# Patient Record
Sex: Female | Born: 1980 | Race: White | Hispanic: No | Marital: Single | Smoking: Former smoker
Health system: Southern US, Community
[De-identification: ages and names within clinical notes are randomized; demographics above are authoritative.]

## PROBLEM LIST (undated history)

## (undated) DIAGNOSIS — Z8619 Personal history of other infectious and parasitic diseases: Secondary | ICD-10-CM

## (undated) HISTORY — DX: Personal history of other infectious and parasitic diseases: Z86.19

## (undated) HISTORY — PX: WISDOM TOOTH EXTRACTION: SHX21

## (undated) HISTORY — PX: CHOLECYSTECTOMY: SHX55

## (undated) HISTORY — PX: BREAST SURGERY: SHX581

---
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
## 2001-01-09 ENCOUNTER — Inpatient Hospital Stay (HOSPITAL_COMMUNITY): Admission: AD | Admit: 2001-01-09 | Discharge: 2001-01-09 | Payer: Self-pay | Admitting: *Deleted

## 2001-02-05 ENCOUNTER — Other Ambulatory Visit: Admission: RE | Admit: 2001-02-05 | Discharge: 2001-02-05 | Payer: Self-pay | Admitting: Obstetrics and Gynecology

## 2001-02-14 ENCOUNTER — Inpatient Hospital Stay (HOSPITAL_COMMUNITY): Admission: AD | Admit: 2001-02-14 | Discharge: 2001-02-14 | Payer: Self-pay | Admitting: Obstetrics and Gynecology

## 2001-07-01 ENCOUNTER — Encounter: Payer: Self-pay | Admitting: Obstetrics and Gynecology

## 2001-07-01 ENCOUNTER — Observation Stay (HOSPITAL_COMMUNITY): Admission: AD | Admit: 2001-07-01 | Discharge: 2001-07-02 | Payer: Self-pay | Admitting: Obstetrics and Gynecology

## 2001-09-02 ENCOUNTER — Inpatient Hospital Stay (HOSPITAL_COMMUNITY): Admission: AD | Admit: 2001-09-02 | Discharge: 2001-09-05 | Payer: Self-pay | Admitting: Obstetrics and Gynecology

## 2001-09-19 ENCOUNTER — Emergency Department (HOSPITAL_COMMUNITY): Admission: EM | Admit: 2001-09-19 | Discharge: 2001-09-19 | Payer: Self-pay | Admitting: Emergency Medicine

## 2002-01-16 ENCOUNTER — Other Ambulatory Visit: Admission: RE | Admit: 2002-01-16 | Discharge: 2002-01-16 | Payer: Self-pay | Admitting: Obstetrics and Gynecology

## 2002-05-10 ENCOUNTER — Other Ambulatory Visit: Admission: RE | Admit: 2002-05-10 | Discharge: 2002-05-10 | Payer: Self-pay | Admitting: Obstetrics and Gynecology

## 2003-12-10 ENCOUNTER — Emergency Department (HOSPITAL_COMMUNITY): Admission: EM | Admit: 2003-12-10 | Discharge: 2003-12-10 | Payer: Self-pay | Admitting: Emergency Medicine

## 2003-12-29 ENCOUNTER — Other Ambulatory Visit: Admission: RE | Admit: 2003-12-29 | Discharge: 2003-12-29 | Payer: Self-pay | Admitting: Obstetrics and Gynecology

## 2004-04-04 ENCOUNTER — Inpatient Hospital Stay (HOSPITAL_COMMUNITY): Admission: AD | Admit: 2004-04-04 | Discharge: 2004-04-06 | Payer: Self-pay | Admitting: Obstetrics and Gynecology

## 2004-12-03 ENCOUNTER — Ambulatory Visit (HOSPITAL_COMMUNITY): Admission: RE | Admit: 2004-12-03 | Discharge: 2004-12-03 | Payer: Self-pay | Admitting: Obstetrics and Gynecology

## 2004-12-29 ENCOUNTER — Other Ambulatory Visit: Admission: RE | Admit: 2004-12-29 | Discharge: 2004-12-29 | Payer: Self-pay | Admitting: Obstetrics and Gynecology

## 2005-05-29 ENCOUNTER — Inpatient Hospital Stay (HOSPITAL_COMMUNITY): Admission: AD | Admit: 2005-05-29 | Discharge: 2005-05-29 | Payer: Self-pay | Admitting: Obstetrics and Gynecology

## 2005-06-02 ENCOUNTER — Inpatient Hospital Stay (HOSPITAL_COMMUNITY): Admission: AD | Admit: 2005-06-02 | Discharge: 2005-06-04 | Payer: Self-pay | Admitting: Obstetrics and Gynecology

## 2007-12-26 ENCOUNTER — Emergency Department (HOSPITAL_COMMUNITY): Admission: EM | Admit: 2007-12-26 | Discharge: 2007-12-26 | Payer: Self-pay | Admitting: Emergency Medicine

## 2009-10-25 ENCOUNTER — Emergency Department (HOSPITAL_COMMUNITY): Admission: EM | Admit: 2009-10-25 | Discharge: 2009-10-25 | Payer: Self-pay | Admitting: Emergency Medicine

## 2010-11-07 LAB — URINALYSIS, ROUTINE W REFLEX MICROSCOPIC
Bilirubin Urine: NEGATIVE
Glucose, UA: NEGATIVE mg/dL
Ketones, ur: NEGATIVE mg/dL
Nitrite: NEGATIVE
Protein, ur: NEGATIVE mg/dL
Specific Gravity, Urine: 1.005 (ref 1.005–1.030)
Urobilinogen, UA: 0.2 mg/dL (ref 0.0–1.0)
pH: 6.5 (ref 5.0–8.0)

## 2010-11-07 LAB — POCT I-STAT, CHEM 8
Calcium, Ion: 1.14 mmol/L (ref 1.12–1.32)
Chloride: 110 mEq/L (ref 96–112)
Glucose, Bld: 95 mg/dL (ref 70–99)
HCT: 41 % (ref 36.0–46.0)
Hemoglobin: 13.9 g/dL (ref 12.0–15.0)
TCO2: 22 mmol/L (ref 0–100)

## 2010-11-07 LAB — URINE MICROSCOPIC-ADD ON

## 2010-11-07 LAB — POCT PREGNANCY, URINE: Preg Test, Ur: NEGATIVE

## 2010-11-07 LAB — URINE CULTURE: Culture: NO GROWTH

## 2010-12-31 NOTE — H&P (Signed)
[facility]  Patient:    [name], [name] Visit Number: [id number] MRN: [id number]          Service Type: OBS Location: 910B 9158 01 Attending Physician:  Jaymes Graff A Dictated by:   Mack Guise, C.N.M. Admit Date:  09/02/2001                           History and Physical  CHIEF COMPLAINT:              [name] is a 30 year old gravida 2 para 0, 0-1-0, at 40-2/7th weeks, estimated date of delivery August 31, 2001, who presents with spontaneous rupture of membranes at 4 a.m. this morning with clear fluid.  HISTORY OF PRESENT ILLNESS:   She is contracting mildly and irregularity. Positive fetal movement.  No bleeding.  Denies any headache, visual changes, or epigastric pain.  [name] did not phone until approximately 8 p.m. this evening despite her water being broken through the day.  She does report any signs or symptoms of infection.  She is group B strep negative.  Her pregnancy has been followed by the CNM service at [facility], and is remarkable for:                               1. PENICILLIN allergy.                               2. History of abnormal Pap smear.                               3. First trimester spotting.                               4. Group B strep negative.  PAST OBSTETRICAL HISTORY:     1. In January 2001, elective AB with no                                  problems.                               2. Present pregnancy.  PRENATAL LABORATORY DATA:     On February 05, 2001 hemoglobin and hematocrit 12.6 and 37.5, platelets 227,000.  Blood type and Rh B-positive.  Antibody screen negative.  VDRL nonreactive.  Rubella immune.  Hepatitis B surface antigen negative.  HIV nonreactive.  Pap smear within normal limits.  AFB/free beta hCG within normal range.  At 28 weeks one hour glucose challenge 113.  At 36 weeks culture of the vaginal tract negative for group B strep.  HISTORY PRESENT PREGNANCY:    The patient was  initially evaluated at the office of [facility] on February 07, 2001 at approximately [redacted] weeks gestation.  EDD confirmed by LMP dates and early pregnancy ultrasonography.  This patients pregnancy has been essentially unremarkable.  She has been size equal to dates throughout, normotensive with no proteinuria.  PAST MEDICAL HISTORY:         History of abnormal Pap and colpo.  PAST SURGICAL HISTORY:  Wisdom teeth.  FAMILY HISTORY:               Patients maternal aunt with history of MI. Paternal grandmother, hypertension.  Patients mother, varicose veins. Maternal cousin with diabetes.  Mother grandfather, prostate CA.  Maternal aunt, stroke.  Patients Dad with history of migraine headaches and patients mother with anxiety attacks.  GENETIC HISTORY:              Father of the baby with a history of heart murmur at birth.  No functional difficulties with his heart.  Maternal cousin mentally retarded.  ALLERGIES:                    PENICILLIN.  SOCIAL HISTORY:               Denies the use of tobacco, alcohol, or illicit drugs.  [name] is a single 30 year old Caucasian female.  The father of the baby, [name], is involved and supportive.  They are [place] in their faith.  REVIEW OF SYSTEMS:            There are no signs of symptoms suggestive of focal or systemic disease, and the patient is typical of one with intrauterine pregnancy at term.  PHYSICAL EXAMINATION:  VITAL SIGNS:                  Stable.  Afebrile.  HEENT:                        Unremarkable.  HEART:                        Regular rate and rhythm.  LUNGS:                        Clear.  ABDOMEN:                      Gravid contour.  Uterine fundus is noted to extend 38 cm above the level of the pubic symphysis.  Leopolds maneuver finds the infant to be in longitudinal lie, cephalic presentation.  Estimated fetal weight is 7-1/2 pounds.  Fetal heart rate is reactive and reassuring.  The patient is contracting  mildly and irregularly.  PELVIC:                       Sterile speculum examination finds positive pooling, positive nitrazine, positive fern.  Cervical examination finds the cervix to b 1 cm dilated, 60% effaced.  NEUROLOGIC:                   DTRs 1+ with no clonus.  ASSESSMENT:                   Intrauterine pregnancy at term with premature rupture of membranes x18 hours.  PLAN:                         Admit per Dr. Jaymes Graff.  Cervical ripening with the use of Cytotec.  Pitocin augmentation of labor.  The risks and benefits of cervical ripening and induction of labor were discussed in detail with the patient in language she could understand and she has indicated her agreement. Dictated by:   Mack Guise, C.N.M. Attending Physician:  Michael Litter DD:  09/02/01 TD:  09/03/01 Job: [id number]

## 2010-12-31 NOTE — H&P (Signed)
NAME:  [name], [name]               ACCOUNT NO.:  [id number]   MEDICAL RECORD NO.:  [id number]          PATIENT TYPE:  INP   LOCATION:  9164                          FACILITY:  [facility]   PHYSICIAN:  Naima A. Dillard, M.D. DATE OF BIRTH:  [birth year]   DATE OF ADMISSION:  06/02/2005  DATE OF DISCHARGE:                                HISTORY & PHYSICAL   HISTORY:  This is a 30 year old gravida 4, para 2, 0, 1, 2, at 39-3/7 weeks  who presents with leaking of fluid x2-3 hours which is clear. She does  report mild contractions. Pregnancy has been followed by the nurse midwife  service and remarkable for:  1.  Closely spaced pregnancy.  2.  Unsure last menstrual period.  3.  Conceived on OCPs.  4.  Obesity.  5.  Group B strep negative.   OB HISTORY:  Remarkable for an elective abortion in 2001. Vaginal delivery  of a female in 2003 at [redacted] weeks gestation, weighing 7 pounds, 3 ounces with  no complications. She had a vaginal delivery in 2005 of a female at [redacted] weeks  gestation, weighing 7 pounds, 3 ounces with no complications.   PAST MEDICAL HISTORY:  1.  Remarkable for history of abnormal Pap.  2.  Childhood Varicella.   SURGICAL HISTORY:  Remarkable for wisdom teeth in 2002.   FAMILY HISTORY:  Remarkable for an aunt with MI. Grandmother with  hypertension. Mother with varicosities. A cousin with diabetes. Father with  migraines. An aunt with stroke. A cousin with seizures. Grandfather with  prostate cancer. Mother with anxiety disorder.   GENETIC HISTORY:  Remarkable for father of the baby with a heart murmur, and  a cousin who is retarded.   SOCIAL HISTORY:  The patient is single. Father of the baby, [name], is  involved and supportive. She is of the [place] faith. She denies any  alcohol, tobacco or drug use.   PRENATAL LABS:  Hemoglobin 11.7, platelets 174,000. Blood type B positive,  antibody screen negative. RPR nonreactive. Rubella immune. Hepatitis  negative. HIV  negative. Pap test normal. Cystic fibrosis negative.   HISTORY OF CURRENT PREGNANCY:  The patient entered care at [redacted] weeks  gestation. She had an ultrasound for dates. At that time a quadruple screen  was declined. Ultrasound at 18 weeks was normal. She had a Glucola at 27  weeks which was normal. She had an ultrasound at 28 weeks with normal  findings. She decided at 30 weeks to have a tubal ligation secondary to not  having a boy baby this time although she does seem somewhat ambivalent about  that. She had a negative group B strep at term.   PHYSICAL EXAMINATION:  VITAL SIGNS:  Stable. Afebrile.  HEENT:  Within normal limits.  NECK:  Thyroid normal, not enlarged.  CHEST:  Clear to auscultation.  HEART:  Regular rate and rhythm.  ABDOMEN:  Gravid, vertex, Leopold's. External fetal monitor shows a reactive  fetal heart rate with mild contractions every 2 minutes.  PELVIC:  Vaginal exam shows clear fluid leaking which is positive for  Nitrazine and grossly pooling. Cervix is 3-4, 75%, -2, vertex.  EXTREMITIES:  Within normal limits.   ASSESSMENT:  1.  Intrauterine pregnancy at term.  2.  Premature rupture of membranes at term.  3.  Prodromal labor.  4.  Group B strep negative.   PLAN:  1.  Options of expectant management versus Pitocin augmentation were      discussed with the patient. She elects to proceed with Pitocin      augmentation which will be done per low dose protocol.  2.  Epidural p.r.n.  3.  Routine CNM orders.  4.  Anticipate spontaneous vaginal delivery.      Marie L. Williams, C.N.M.      Naima A. Normand Sloop, M.D.  Electronically Signed    MLW/MEDQ  D:  06/02/2005  T:  06/02/2005  Job:  [id number]

## 2010-12-31 NOTE — H&P (Signed)
NAME:  [name], [name]                         ACCOUNT NO.:  [id number]   MEDICAL RECORD NO.:  [id number]                   PATIENT TYPE:  INP   LOCATION:  9173                                 FACILITY:  [facility]   PHYSICIAN:  Naima A. Dillard, M.D.              DATE OF BIRTH:  [birth year]   DATE OF ADMISSION:  04/04/2004  DATE OF DISCHARGE:                                HISTORY & PHYSICAL   [name] is a 30 year old gravida 3, para 1-0-1-1.  She is at [redacted] weeks  gestation, EDD April 23, 2004 by pregnancy ultrasound.  She presents with  spontaneous rupture of membranes at home 0945 this a.m., clear fluid,  positive fetal movement.  She reports mild contractions.  No vaginal  bleeding.  Denies any history, visual changes, or epigastric pain.  Her  pregnancy has been followed by the C.N.M. service at [facility] and is remarkable  for questionable LMP, late prenatal care, obesity.  This patient entered  prenatal care on November 27, 2003 at approximately [redacted] weeks gestation.  EDC  determined by pregnancy ultrasound at 19 weeks 3 days.  Her pregnancy is  significant for weight loss of 16 pounds throughout her pregnancy.  Patient  did report that her appetite was decreased, but states she was eating with  no nausea or vomiting.  She began her pregnancy with a weight of 249.  She  has been followed with serial ultrasounds approximately one per month which  has shown adequate and appropriate growth.  At 34 weeks ultrasound was at  the 75th-90th percentile.  Otherwise, her pregnancy has been essentially  unremarkable.  She has been normotensive with no proteinuria.   PRENATAL LABORATORIES:  On November 27, 2003 hemoglobin and hematocrit 11.5 and  35.6, platelets 184,000.  Blood type and Rh B+.  Antibody screen negative.  VDRL nonreactive.  Rubella immune.  Hepatitis B surface antigen negative.  HIV nonreactive.  GC and Chlamydia negative and negative.  __________  testing negative.  On December 01, 2003  one-hour glucose challenge shows within  normal limits.   SOCIAL HISTORY:  [name] is a 30 year old Caucasian female.  She is  single.  Christian in her faith.  [name] is the father of the baby and  he is supportive and here at the hospital.   PAST MEDICAL HISTORY:  1. Significant for an abnormal Pap smear in 2001 with colposcopy.  All     further Pap smears have been within normal limits.  2. Patient had her wisdom teeth removed.   FAMILY HISTORY:  Maternal aunt with a history of MI.  Paternal grandmother  with a history of chronic hypertension.  Maternal grandfather prostate CA.  Patient's mother suffers with anxiety and the father of the baby has a heart  murmur which has never required surgery or medication.   ALLERGIES:  PENICILLIN.   HABITS:  She denies the use of tobacco,  alcohol, or illicit drugs.   PAST OBSTETRICAL HISTORY:  In 2003 patient had a normal spontaneous vaginal  delivery with the birth of a 7 pound 3 ounce female infant at term weighing  7 pounds 3 ounces with no complications.  In 2001 patient had a first  trimester elective AB with no complications and the present pregnancy.   REVIEW OF SYSTEMS:  There are no signs or symptoms of focal or systemic  disease and the patient is typical of one with a uterine pregnancy at term  in labor.   PHYSICAL EXAMINATION:  VITAL SIGNS:  Stable, afebrile.  HEENT:  Unremarkable.  HEART:  Regular rate and rhythm.  LUNGS:  Clear.  ABDOMEN:  Gravid in its contour.  Uterine fundus is noted to extend 40 cm  above the level of the pubic symphysis.  Leopold's maneuvers finds the  infant to be in a longitudinal lie, cephalic presentation and the estimated  fetal weight is 8-8.5 pounds.  The baseline of the fetal heart rate monitor  is 140s with average long-term variability.  Reactivity is present with no  periodic changes.  PELVIC:  Digital examination of the cervix finds it to be 3 cm dilated, 70%  effaced with the  cephalic presenting part at a -2 station.  EXTREMITIES:  No pathologic edema.  DTRs are 1+ with no clonus.   ASSESSMENT:  1. Intrauterine pregnancy at term.  2. Premature rupture of membranes in early labor.   PLAN:  1. Admit per Dr. Jaymes Graff.  2. Routine C.N.M. care.     Rica Koyanagi, C.N.M.               Naima A. Normand Sloop, M.D.    SDM/MEDQ  D:  04/04/2004  T:  04/04/2004  Job:  [id number]
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
## 2012-11-14 LAB — OB RESULTS CONSOLE RPR: RPR: NONREACTIVE

## 2012-11-14 LAB — OB RESULTS CONSOLE ABO/RH: RH Type: POSITIVE

## 2012-11-14 LAB — OB RESULTS CONSOLE GC/CHLAMYDIA: Chlamydia: NEGATIVE

## 2012-11-14 LAB — OB RESULTS CONSOLE HEPATITIS B SURFACE ANTIGEN: Hepatitis B Surface Ag: NEGATIVE

## 2012-11-14 LAB — OB RESULTS CONSOLE HIV ANTIBODY (ROUTINE TESTING): HIV: NONREACTIVE

## 2012-11-30 ENCOUNTER — Emergency Department (HOSPITAL_BASED_OUTPATIENT_CLINIC_OR_DEPARTMENT_OTHER)
Admission: EM | Admit: 2012-11-30 | Discharge: 2012-11-30 | Disposition: A | Discharge status: Home / Self Care | Payer: BC Managed Care – PPO | Attending: Emergency Medicine | Admitting: Emergency Medicine

## 2012-11-30 ENCOUNTER — Encounter (HOSPITAL_BASED_OUTPATIENT_CLINIC_OR_DEPARTMENT_OTHER): Payer: Self-pay

## 2012-11-30 DIAGNOSIS — L509 Urticaria, unspecified: Secondary | ICD-10-CM | POA: Insufficient documentation

## 2012-11-30 DIAGNOSIS — L259 Unspecified contact dermatitis, unspecified cause: Secondary | ICD-10-CM

## 2012-11-30 DIAGNOSIS — R21 Rash and other nonspecific skin eruption: Secondary | ICD-10-CM | POA: Insufficient documentation

## 2012-11-30 DIAGNOSIS — O218 Other vomiting complicating pregnancy: Secondary | ICD-10-CM | POA: Insufficient documentation

## 2012-11-30 MED ORDER — LORATADINE 10 MG PO TABS: 10.0000 mg | ORAL_TABLET | Freq: Every day | ORAL | Status: DC

## 2012-11-30 MED ORDER — FAMOTIDINE 20 MG PO TABS: 20.0000 mg | ORAL_TABLET | Freq: Two times a day (BID) | ORAL | Status: DC

## 2012-11-30 MED ORDER — PREDNISONE 20 MG PO TABS: ORAL_TABLET | ORAL | Status: DC

## 2012-11-30 NOTE — ED Notes (Signed)
Pt reports a 3 week history of a rash on bilateral legs. Pt is 3 months pregnant.

## 2012-11-30 NOTE — ED Provider Notes (Signed)
History     CSN: [id number]  Arrival date & time 11/30/12  1331   First MD Initiated Contact with Patient 11/30/12 1507      Chief Complaint  Patient presents with  . Rash    (Consider location/radiation/quality/duration/timing/severity/associated sxs/prior treatment) HPI This 32 year old pregnant female has 3 weeks of an itchy non-painful rash on both lower legs with scattered hives with slight clear blisters after working outside in the field, the rash is not responsive to over-the-counter calamine lotion, she has no systemic illness symptoms, she is no fever tongue swelling lip swelling stridor or drooling shortness of breath chest pain cough abdominal pain vaginal bleeding bruising rash painful rash or other concerns. History reviewed. No pertinent past medical history.  Past Surgical History  Procedure Laterality Date  . Cholecystectomy      No family history on file.  History  Substance Use Topics  . Smoking status: Not on file  . Smokeless tobacco: Never Used  . Alcohol Use: No    OB History   Grav Para Term Preterm Abortions TAB SAB Ect Mult Living   1               Review of Systems 10 Systems reviewed and are negative for acute change except as noted in the HPI. Allergies  Penicillins  Home Medications   Current Outpatient Rx  Name  Route  Sig  Dispense  Refill  . famotidine (PEPCID) 20 MG tablet   Oral   Take 1 tablet (20 mg total) by mouth 2 (two) times daily.   10 tablet   0   . loratadine (CLARITIN) 10 MG tablet   Oral   Take 1 tablet (10 mg total) by mouth daily. One po daily x 5 days   5 tablet   0   . predniSONE (DELTASONE) 20 MG tablet      3 tabs po daily x 3 days, then 2 tabs x 3 days, then 1.5 tabs x 3 days, then 1 tab x 3 days, then 0.5 tabs x 3 days   27 tablet   0     BP 134/76  Pulse 84  Temp(Src) 97.9 F (36.6 C) (Oral)  Resp 18  Ht 5\' 8"  (1.727 m)  Wt 265 lb (120.203 kg)  BMI 40.3 kg/m2  SpO2 99%  LMP  08/29/2012  Physical Exam  Nursing note and vitals reviewed. Constitutional:  Awake, alert, nontoxic appearance.  HENT:  Head: Atraumatic.  Eyes: Right eye exhibits no discharge. Left eye exhibits no discharge.  Neck: Neck supple.  Cardiovascular: Normal rate and regular rhythm.   No murmur heard. Pulmonary/Chest: Effort normal and breath sounds normal. No respiratory distress. She has no wheezes. She has no rales. She exhibits no tenderness.  Abdominal: Soft. There is no tenderness. There is no rebound.  Musculoskeletal: She exhibits no edema and no tenderness.  Baseline ROM, no obvious new focal weakness.  Neurological: She is alert.  Mental status and motor strength appears baseline for patient and situation.  Skin: Rash noted.  Bilateral lower legs have scattered urticarial plaques easily blanched nontender no petechiae no purpura if you do have some very superficial clear vesicles and some of the hives occur in linear streaks  Psychiatric: She has a normal mood and affect.    ED Course  Procedures (including critical care time)  I doubt cellulitis, necrotizing fasciitis, abscess, sepsis, anaphylaxis, or other condition requiring further evaluation today in the emergency room. Patient informed of clinical course,  understand medical decision-making process, and agree with plan. Labs Reviewed - No data to display No results found.   1. Contact dermatitis       MDM          Hurman Horn, MD 11/30/12 2035

## 2013-06-11 ENCOUNTER — Telehealth (HOSPITAL_COMMUNITY): Payer: Self-pay | Admitting: *Deleted

## 2013-06-11 ENCOUNTER — Encounter (HOSPITAL_COMMUNITY): Payer: Self-pay | Admitting: *Deleted

## 2013-06-11 LAB — OB RESULTS CONSOLE GBS: GBS: NEGATIVE

## 2013-06-11 NOTE — Telephone Encounter (Signed)
Preadmission screen  

## 2013-06-12 ENCOUNTER — Encounter (HOSPITAL_COMMUNITY): Payer: Self-pay

## 2013-06-12 ENCOUNTER — Encounter (HOSPITAL_COMMUNITY): Payer: BC Managed Care – PPO | Admitting: Anesthesiology

## 2013-06-12 ENCOUNTER — Inpatient Hospital Stay (HOSPITAL_COMMUNITY)
Admission: RE | Admit: 2013-06-12 | Discharge: 2013-06-14 | DRG: 767 | Disposition: A | Discharge status: Home / Self Care | Payer: BC Managed Care – PPO | Source: Ambulatory Visit | Attending: Obstetrics and Gynecology | Admitting: Obstetrics and Gynecology

## 2013-06-12 ENCOUNTER — Inpatient Hospital Stay (HOSPITAL_COMMUNITY): Payer: BC Managed Care – PPO | Admitting: Anesthesiology

## 2013-06-12 ENCOUNTER — Inpatient Hospital Stay (HOSPITAL_COMMUNITY)
Admission: EM | Admit: 2013-06-12 | Discharge: 2013-06-12 | Disposition: A | Discharge status: Home / Self Care | Payer: BC Managed Care – PPO | Attending: Obstetrics and Gynecology | Admitting: Obstetrics and Gynecology

## 2013-06-12 VITALS — BP 94/57 | HR 77 | Temp 97.6°F | Resp 18 | Ht 68.0 in | Wt 255.0 lb

## 2013-06-12 DIAGNOSIS — Z87891 Personal history of nicotine dependence: Secondary | ICD-10-CM

## 2013-06-12 DIAGNOSIS — Z302 Encounter for sterilization: Secondary | ICD-10-CM

## 2013-06-12 DIAGNOSIS — Z880 Allergy status to penicillin: Secondary | ICD-10-CM

## 2013-06-12 LAB — ABO/RH: ABO/RH(D): B POS

## 2013-06-12 LAB — CBC
HCT: 35.2 % — ABNORMAL LOW (ref 36.0–46.0)
Hemoglobin: 12.2 g/dL (ref 12.0–15.0)
MCH: 29 pg (ref 26.0–34.0)
MCHC: 34.7 g/dL (ref 30.0–36.0)
Platelets: 153 10*3/uL (ref 150–400)
RDW: 13 % (ref 11.5–15.5)

## 2013-06-12 MED ORDER — LACTATED RINGERS IV SOLN
INTRAVENOUS | Status: AC
  Administered 2013-06-13 (×2): via INTRAVENOUS

## 2013-06-12 MED ORDER — CITRIC ACID-SODIUM CITRATE 334-500 MG/5ML PO SOLN: 30.0000 mL | ORAL | Status: DC | PRN

## 2013-06-12 MED ORDER — MEASLES, MUMPS & RUBELLA VAC ~~LOC~~ INJ
0.5000 mL | INJECTION | Freq: Once | SUBCUTANEOUS | Status: DC
  Filled 2013-06-12: qty 0.5

## 2013-06-12 MED ORDER — SIMETHICONE 80 MG PO CHEW: 80.0000 mg | CHEWABLE_TABLET | ORAL | Status: DC | PRN

## 2013-06-12 MED ORDER — DIBUCAINE 1 % RE OINT: 1.0000 "application " | TOPICAL_OINTMENT | RECTAL | Status: DC | PRN

## 2013-06-12 MED ORDER — DIPHENHYDRAMINE HCL 50 MG/ML IJ SOLN: 12.5000 mg | INTRAMUSCULAR | Status: DC | PRN

## 2013-06-12 MED ORDER — OXYCODONE-ACETAMINOPHEN 5-325 MG PO TABS: 1.0000 | ORAL_TABLET | ORAL | Status: DC | PRN

## 2013-06-12 MED ORDER — ZOLPIDEM TARTRATE 5 MG PO TABS: 5.0000 mg | ORAL_TABLET | Freq: Every evening | ORAL | Status: DC | PRN

## 2013-06-12 MED ORDER — EPHEDRINE 5 MG/ML INJ
10.0000 mg | INTRAVENOUS | Status: DC | PRN
  Filled 2013-06-12: qty 4
  Filled 2013-06-12: qty 2

## 2013-06-12 MED ORDER — FAMOTIDINE 20 MG PO TABS
20.0000 mg | ORAL_TABLET | Freq: Two times a day (BID) | ORAL | Status: DC
  Administered 2013-06-13: 20 mg via ORAL
  Filled 2013-06-12: qty 1

## 2013-06-12 MED ORDER — EPHEDRINE 5 MG/ML INJ
10.0000 mg | INTRAVENOUS | Status: DC | PRN
  Filled 2013-06-12: qty 2

## 2013-06-12 MED ORDER — OXYTOCIN 40 UNITS IN LACTATED RINGERS INFUSION - SIMPLE MED
1.0000 m[IU]/min | INTRAVENOUS | Status: DC
  Administered 2013-06-12: 1 m[IU]/min via INTRAVENOUS
  Filled 2013-06-12: qty 1000

## 2013-06-12 MED ORDER — WITCH HAZEL-GLYCERIN EX PADS: 1.0000 "application " | MEDICATED_PAD | CUTANEOUS | Status: DC | PRN

## 2013-06-12 MED ORDER — PRENATAL MULTIVITAMIN CH: 1.0000 | ORAL_TABLET | Freq: Every day | ORAL | Status: DC

## 2013-06-12 MED ORDER — PHENYLEPHRINE 40 MCG/ML (10ML) SYRINGE FOR IV PUSH (FOR BLOOD PRESSURE SUPPORT)
80.0000 ug | PREFILLED_SYRINGE | INTRAVENOUS | Status: DC | PRN
  Filled 2013-06-12: qty 10
  Filled 2013-06-12: qty 2

## 2013-06-12 MED ORDER — OXYTOCIN 40 UNITS IN LACTATED RINGERS INFUSION - SIMPLE MED: 62.5000 mL/h | INTRAVENOUS | Status: DC

## 2013-06-12 MED ORDER — IBUPROFEN 600 MG PO TABS: 600.0000 mg | ORAL_TABLET | Freq: Four times a day (QID) | ORAL | Status: DC | PRN

## 2013-06-12 MED ORDER — ONDANSETRON HCL 4 MG/2ML IJ SOLN: 4.0000 mg | INTRAMUSCULAR | Status: DC | PRN

## 2013-06-12 MED ORDER — TERBUTALINE SULFATE 1 MG/ML IJ SOLN: 0.2500 mg | Freq: Once | INTRAMUSCULAR | Status: DC | PRN

## 2013-06-12 MED ORDER — IBUPROFEN 600 MG PO TABS
600.0000 mg | ORAL_TABLET | Freq: Four times a day (QID) | ORAL | Status: DC
  Administered 2013-06-12 – 2013-06-14 (×6): 600 mg via ORAL
  Filled 2013-06-12 (×6): qty 1

## 2013-06-12 MED ORDER — DIPHENHYDRAMINE HCL 25 MG PO CAPS: 25.0000 mg | ORAL_CAPSULE | Freq: Four times a day (QID) | ORAL | Status: DC | PRN

## 2013-06-12 MED ORDER — ONDANSETRON HCL 4 MG PO TABS: 4.0000 mg | ORAL_TABLET | ORAL | Status: DC | PRN

## 2013-06-12 MED ORDER — LIDOCAINE HCL (PF) 1 % IJ SOLN
INTRAMUSCULAR | Status: DC | PRN
  Administered 2013-06-12 (×2): 5 mL

## 2013-06-12 MED ORDER — OXYTOCIN 40 UNITS IN LACTATED RINGERS INFUSION - SIMPLE MED: 62.5000 mL/h | INTRAVENOUS | Status: DC | PRN

## 2013-06-12 MED ORDER — LANOLIN HYDROUS EX OINT: TOPICAL_OINTMENT | CUTANEOUS | Status: DC | PRN

## 2013-06-12 MED ORDER — PHENYLEPHRINE 40 MCG/ML (10ML) SYRINGE FOR IV PUSH (FOR BLOOD PRESSURE SUPPORT)
80.0000 ug | PREFILLED_SYRINGE | INTRAVENOUS | Status: DC | PRN
  Filled 2013-06-12: qty 2

## 2013-06-12 MED ORDER — LACTATED RINGERS IV SOLN: 500.0000 mL | INTRAVENOUS | Status: DC | PRN

## 2013-06-12 MED ORDER — SENNOSIDES-DOCUSATE SODIUM 8.6-50 MG PO TABS
2.0000 | ORAL_TABLET | ORAL | Status: DC
  Administered 2013-06-12 – 2013-06-14 (×2): 2 via ORAL
  Filled 2013-06-12 (×2): qty 2

## 2013-06-12 MED ORDER — FENTANYL 2.5 MCG/ML BUPIVACAINE 1/10 % EPIDURAL INFUSION (WH - ANES)
14.0000 mL/h | INTRAMUSCULAR | Status: DC | PRN
  Administered 2013-06-12 (×2): 14 mL/h via EPIDURAL
  Filled 2013-06-12 (×2): qty 125

## 2013-06-12 MED ORDER — TETANUS-DIPHTH-ACELL PERTUSSIS 5-2.5-18.5 LF-MCG/0.5 IM SUSP: 0.5000 mL | Freq: Once | INTRAMUSCULAR | Status: DC

## 2013-06-12 MED ORDER — LACTATED RINGERS IV SOLN
INTRAVENOUS | Status: DC
  Administered 2013-06-12 (×3): via INTRAVENOUS

## 2013-06-12 MED ORDER — LIDOCAINE HCL (PF) 1 % IJ SOLN
30.0000 mL | INTRAMUSCULAR | Status: DC | PRN
  Filled 2013-06-12 (×2): qty 30

## 2013-06-12 MED ORDER — ONDANSETRON HCL 4 MG/2ML IJ SOLN: 4.0000 mg | Freq: Four times a day (QID) | INTRAMUSCULAR | Status: DC | PRN

## 2013-06-12 MED ORDER — OXYTOCIN BOLUS FROM INFUSION
500.0000 mL | INTRAVENOUS | Status: DC
  Administered 2013-06-12: 500 mL via INTRAVENOUS

## 2013-06-12 MED ORDER — LACTATED RINGERS IV SOLN
500.0000 mL | Freq: Once | INTRAVENOUS | Status: AC
  Administered 2013-06-12: 500 mL via INTRAVENOUS

## 2013-06-12 MED ORDER — BENZOCAINE-MENTHOL 20-0.5 % EX AERO: 1.0000 "application " | INHALATION_SPRAY | CUTANEOUS | Status: DC | PRN

## 2013-06-12 MED ORDER — ACETAMINOPHEN 325 MG PO TABS: 650.0000 mg | ORAL_TABLET | ORAL | Status: DC | PRN

## 2013-06-12 NOTE — Anesthesia Procedure Notes (Signed)
Epidural Patient location during procedure: OB Start time: 06/12/2013 12:17 PM  Staffing Anesthesiologist: Brayton Caves Performed by: anesthesiologist   Preanesthetic Checklist Completed: patient identified, site marked, surgical consent, pre-op evaluation, timeout performed, IV checked, risks and benefits discussed and monitors and equipment checked  Epidural Patient position: sitting Prep: site prepped and draped and DuraPrep Patient monitoring: continuous pulse ox and blood pressure Approach: midline Injection technique: LOR air  Needle:  Needle type: Tuohy  Needle gauge: 17 G Needle length: 9 cm and 9 Needle insertion depth: 6 cm Catheter type: closed end flexible Catheter size: 19 Gauge Catheter at skin depth: 11 cm Test dose: negative  Assessment Events: blood not aspirated, injection not painful, no injection resistance, negative IV test and no paresthesia  Additional Notes Patient identified.  Risk benefits discussed including failed block, incomplete pain control, headache, nerve damage, paralysis, blood pressure changes, nausea, vomiting, reactions to medication both toxic or allergic, and postpartum back pain.  Patient expressed understanding and wished to proceed.  All questions were answered.  Sterile technique used throughout procedure and epidural site dressed with sterile barrier dressing. No paresthesia or other complications noted.The patient did not experience any signs of intravascular injection such as tinnitus or metallic taste in mouth nor signs of intrathecal spread such as rapid motor block. Please see nursing notes for vital signs.

## 2013-06-12 NOTE — Progress Notes (Signed)
Patient ID: [name], female   DOB: 09/03/1980, 32 y.o.   MRN: [id number] Delivery note:  The pt progressed to full dilatation with decelerations. O2 was given by mask and the position was optimized. She pushed fairly well mand delivered a living female infant ROA over an intact perineum Apgars were 8 and 9 at 1 and 5 minutes. The placenta was intact, the uterus was normal and a left labial laceration could not be made hemostatic with pressure so a 3-0 chromic suture was used for repair. EBL 400 cc's. There were 2 loops of nuchal cord.

## 2013-06-12 NOTE — H&P (Signed)
[name], [name]               ACCOUNT NO.:  [id number]  MEDICAL RECORD NO.:  [id number]  LOCATION:  9162                          FACILITY:  [facility]  PHYSICIAN:  Malachi Pro. Ambrose Mantle, M.D. DATE OF BIRTH:  1981/06/29  DATE OF ADMISSION:  06/12/2013 DATE OF DISCHARGE:                             HISTORY & PHYSICAL   PRESENT ILLNESS:  This is a 32 year old white female, para 3-0-1-3, gravida 5, EDC June 05, 2013, admitted at [redacted] weeks gestation for induction of labor.  She had an ultrasound on November 08, 2012, that was consistent with her last menstrual period, giving a due date of June 05, 2013.  Blood group and type B positive, negative antibody, rubella immune, RPR nonreactive.  Urine culture negative.  Hepatitis B surface antigen negative, HIV negative, GC and Chlamydia negative.  Second trimester screen was negative.  One hour Glucola was 129.  Repeat HIV and RPR negative.  Group B strep negative.  Ultrasound on Jan 11, 2013, 19 weeks 2 days, [facility] June 05, 2013, the patient requested tubal ligation, and if she persists in her request this will be done postpartum.  PAST MEDICAL HISTORY:  Unremarkable medical history.  PAST SURGICAL HISTORY:  Wisdom teeth extraction, cholecystectomy, D and C in 2001, for therapeutic abortion.  ALLERGIES:  Patient claims she is allergic to PENICILLIN CAUSED HIVES, MODERATE TO SEVERE.  No latex allergy.  SOCIAL HISTORY:  The patient is a former smoker, occasionally drank before pregnancy.  Says she has had 12 years of education with some college.  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  She is single.  FAMILY HISTORY:  Mother apparently in good health.  Maternal grandfather had cancer of the prostate.  Paternal grandmother, high blood pressure. Maternal uncle with diabetes.  PHYSICAL EXAMINATION:  VITAL SIGNS:  She is a 5 foot 7 inches, white female, in no distress.  Weight at her last prenatal visit, 59.4, which actually  represents a 10-pound weight loss during pregnancy.  Her blood pressure is 119/74, temperature 98.5, pulse 90, respirations 20. HEART:  Normal size and sounds.  No murmurs. LUNGS:  Clear to auscultation. ABDOMEN:  Soft.  Fundal height 38 cm.  Fetal heart tones normal at the present time, although when she was first placed on the monitor, there were couple deceleration, we will watch those closely.  She is having no contractions, Pitocin has not been started.  ADMITTING IMPRESSION:  Intrauterine pregnancy, [redacted] weeks gestation. Cervix is 2 cm, 20% vertex at a -3.  We will start Pitocin and see if we can develop a labor pattern and watch the baby's heart rate.     Malachi Pro. Ambrose Mantle, M.D.     TFH/MEDQ  D:  06/12/2013  T:  06/12/2013  Job:  [id number]

## 2013-06-12 NOTE — Progress Notes (Signed)
Patient ID: [name], female   DOB: 10-02-1980, 32 y.o.   MRN: [id number] Pitocin is at 15 mu/minute and the contractions are q 2-3 minutes. The cervix is 45 cm 50 % effaced and the vertex is at - 3 station. AROM produced clear fluid Will raise the pitocin

## 2013-06-12 NOTE — Progress Notes (Signed)
Patient ID: [name], female   DOB: 1980-10-06, 32 y.o.   MRN: [id number] Contractions q 2 minutes with some variable decelerationa and good recovery. Cervix 6-7 cm 80-90 % effaced and the vertex is at - 1 station. There is some bloody show.

## 2013-06-12 NOTE — Progress Notes (Signed)
Patient ID: [name], female   DOB: 06/19/1981, 32 y.o.   MRN: [id number] Pt has received an epidural and thwe contractions are q 2 minutes and the cervix is 3 cm 40 % effaqced and the vertex is at - 3 station. The FHR is normal.

## 2013-06-12 NOTE — Anesthesia Preprocedure Evaluation (Signed)

## 2013-06-13 ENCOUNTER — Inpatient Hospital Stay (HOSPITAL_COMMUNITY): Payer: BC Managed Care – PPO | Admitting: Anesthesiology

## 2013-06-13 ENCOUNTER — Encounter (HOSPITAL_COMMUNITY): Payer: Self-pay

## 2013-06-13 ENCOUNTER — Encounter (HOSPITAL_COMMUNITY)
Admission: RE | Disposition: A | Discharge status: Home / Self Care | Payer: Self-pay | Source: Ambulatory Visit | Attending: Obstetrics and Gynecology

## 2013-06-13 ENCOUNTER — Encounter (HOSPITAL_COMMUNITY): Payer: BC Managed Care – PPO | Admitting: Anesthesiology

## 2013-06-13 HISTORY — PX: TUBAL LIGATION: SHX77

## 2013-06-13 LAB — CBC
MCH: 28.9 pg (ref 26.0–34.0)
MCV: 84.6 fL (ref 78.0–100.0)
Platelets: 148 10*3/uL — ABNORMAL LOW (ref 150–400)
RDW: 13.1 % (ref 11.5–15.5)
WBC: 11 10*3/uL — ABNORMAL HIGH (ref 4.0–10.5)

## 2013-06-13 LAB — SURGICAL PCR SCREEN: MRSA, PCR: NEGATIVE

## 2013-06-13 LAB — RPR: RPR Ser Ql: NONREACTIVE

## 2013-06-13 SURGERY — LIGATION, FALLOPIAN TUBE, POSTPARTUM
Anesthesia: Epidural | Site: Abdomen | Laterality: Bilateral | Wound class: Clean

## 2013-06-13 MED ORDER — MIDAZOLAM HCL 2 MG/2ML IJ SOLN
INTRAMUSCULAR | Status: AC
  Filled 2013-06-13: qty 2

## 2013-06-13 MED ORDER — HYDROCODONE-ACETAMINOPHEN 5-325 MG PO TABS
1.0000 | ORAL_TABLET | ORAL | Status: DC | PRN
  Administered 2013-06-13: 1 via ORAL
  Filled 2013-06-13: qty 1

## 2013-06-13 MED ORDER — LIDOCAINE-EPINEPHRINE (PF) 2 %-1:200000 IJ SOLN
INTRAMUSCULAR | Status: AC
  Filled 2013-06-13: qty 20

## 2013-06-13 MED ORDER — KETOROLAC TROMETHAMINE 30 MG/ML IJ SOLN: 15.0000 mg | Freq: Once | INTRAMUSCULAR | Status: DC | PRN

## 2013-06-13 MED ORDER — PROMETHAZINE HCL 25 MG/ML IJ SOLN: 6.2500 mg | INTRAMUSCULAR | Status: DC | PRN

## 2013-06-13 MED ORDER — MEPERIDINE HCL 25 MG/ML IJ SOLN: 6.2500 mg | INTRAMUSCULAR | Status: DC | PRN

## 2013-06-13 MED ORDER — 0.9 % SODIUM CHLORIDE (POUR BTL) OPTIME
TOPICAL | Status: DC | PRN
  Administered 2013-06-13: 1000 mL

## 2013-06-13 MED ORDER — SODIUM BICARBONATE 8.4 % IV SOLN
INTRAVENOUS | Status: AC
  Filled 2013-06-13: qty 50

## 2013-06-13 MED ORDER — METOCLOPRAMIDE HCL 10 MG PO TABS
10.0000 mg | ORAL_TABLET | Freq: Once | ORAL | Status: AC
  Administered 2013-06-13: 10 mg via ORAL
  Filled 2013-06-13: qty 1

## 2013-06-13 MED ORDER — SODIUM BICARBONATE 8.4 % IV SOLN
INTRAVENOUS | Status: DC | PRN
  Administered 2013-06-13 (×4): 5 mL via EPIDURAL

## 2013-06-13 MED ORDER — MIDAZOLAM HCL 2 MG/2ML IJ SOLN: 0.5000 mg | Freq: Once | INTRAMUSCULAR | Status: DC | PRN

## 2013-06-13 MED ORDER — HYDROMORPHONE HCL PF 1 MG/ML IJ SOLN: 0.2500 mg | INTRAMUSCULAR | Status: DC | PRN

## 2013-06-13 MED ORDER — LACTATED RINGERS IV SOLN
INTRAVENOUS | Status: DC
  Administered 2013-06-13: 11:00:00 via INTRAVENOUS

## 2013-06-13 MED ORDER — FENTANYL CITRATE 0.05 MG/ML IJ SOLN: 25.0000 ug | INTRAMUSCULAR | Status: DC | PRN

## 2013-06-13 MED ORDER — FAMOTIDINE 20 MG PO TABS
40.0000 mg | ORAL_TABLET | Freq: Once | ORAL | Status: AC
  Administered 2013-06-13: 40 mg via ORAL
  Filled 2013-06-13: qty 2

## 2013-06-13 SURGICAL SUPPLY — 25 items
APPLICATOR COTTON TIP 6IN STRL (MISCELLANEOUS) ×1 IMPLANT
CLOTH BEACON ORANGE TIMEOUT ST (SAFETY) ×2 IMPLANT
CONTAINER PREFILL 10% NBF 15ML (MISCELLANEOUS) ×4 IMPLANT
DRSG COVADERM PLUS 2X2 (GAUZE/BANDAGES/DRESSINGS) ×1 IMPLANT
ELECT REM PT RETURN 9FT ADLT (ELECTROSURGICAL) ×2
ELECTRODE REM PT RTRN 9FT ADLT (ELECTROSURGICAL) ×1 IMPLANT
GLOVE BIO SURGEON STRL SZ7.5 (GLOVE) ×3 IMPLANT
GLOVE SURG SS PI 7.0 STRL IVOR (GLOVE) ×2 IMPLANT
GOWN PREVENTION PLUS LG XLONG (DISPOSABLE) ×4 IMPLANT
GOWN PREVENTION PLUS XLARGE (GOWN DISPOSABLE) ×1 IMPLANT
NS IRRIG 1000ML POUR BTL (IV SOLUTION) ×2 IMPLANT
PACK ABDOMINAL MINOR (CUSTOM PROCEDURE TRAY) ×2 IMPLANT
PENCIL BUTTON HOLSTER BLD 10FT (ELECTRODE) ×2 IMPLANT
SPONGE LAP 4X18 X RAY DECT (DISPOSABLE) ×1 IMPLANT
SUT PDS AB 1 CT1 36 (SUTURE) ×2 IMPLANT
SUT PLAIN 0 NONE (SUTURE) ×4 IMPLANT
SUT PLAIN 2 0 (SUTURE)
SUT PLAIN 3 0 X 1 18 (SUTURE) ×2 IMPLANT
SUT PLAIN ABS 2-0 54XMFL TIE (SUTURE) IMPLANT
SUT VIC AB 0 CT1 27 (SUTURE) ×2
SUT VIC AB 0 CT1 27XBRD ANBCTR (SUTURE) ×1 IMPLANT
SUT VIC AB 3-0 CTX 36 (SUTURE) ×1 IMPLANT
TOWEL OR 17X24 6PK STRL BLUE (TOWEL DISPOSABLE) ×4 IMPLANT
TRAY FOLEY CATH 14FR (SET/KITS/TRAYS/PACK) ×1 IMPLANT
WATER STERILE IRR 1000ML POUR (IV SOLUTION) ×1 IMPLANT

## 2013-06-13 NOTE — Anesthesia Postprocedure Evaluation (Signed)
  Anesthesia Post-op Note  Anesthesia Post Note  Patient: [name]  Procedure(s) Performed: * No procedures listed *  Anesthesia type: Epidural  Patient location: Mother/Baby  Post pain: Pain level controlled  Post assessment: Post-op Vital signs reviewed  Last Vitals:  Filed Vitals:   06/13/13 1615  BP: 121/79  Pulse: 74  Temp: 36.6 C  Resp: 18    Post vital signs: Reviewed  Level of consciousness:alert  Complications: No apparent anesthesia complications

## 2013-06-13 NOTE — Progress Notes (Signed)
Patient ID: [name], female   DOB: 1981-08-12, 32 y.o.   MRN: [id number] Op note:  BTL under epidural without complications.

## 2013-06-13 NOTE — Progress Notes (Signed)
Patient ID: [name], female   DOB: [birth year], 32 y.o.   MRN: [id number] #1 afebrile BP normal HGB stable. Will reaffirm her desire for BTL.

## 2013-06-13 NOTE — Transfer of Care (Signed)
Immediate Anesthesia Transfer of Care Note  Patient: [name]  Procedure(s) Performed: Procedure(s): POST PARTUM TUBAL LIGATION (Bilateral)  Patient Location: PACU  Anesthesia Type:Epidural  Level of Consciousness: awake, alert , oriented and patient cooperative  Airway & Oxygen Therapy: Patient Spontanous Breathing and Patient connected to nasal cannula oxygen  Post-op Assessment: Report given to PACU RN and Post -op Vital signs reviewed and stable  Post vital signs: Reviewed and stable  Complications: No apparent anesthesia complications

## 2013-06-13 NOTE — Op Note (Signed)
[name], [name]               ACCOUNT NO.:  [id number]  MEDICAL RECORD NO.:  [id number]  LOCATION:  9109                          FACILITY:  [facility]  PHYSICIAN:  Malachi Pro. Ambrose Mantle, M.D. DATE OF BIRTH:  1981-04-16  DATE OF PROCEDURE: DATE OF DISCHARGE:                              OPERATIVE REPORT   PREOPERATIVE DIAGNOSIS:  Voluntary sterilization.  POSTOPERATIVE DIAGNOSIS:  Voluntary sterilization.  OPERATION:  Bilateral tubal ligation.  OPERATOR:  Malachi Pro. Ambrose Mantle, M.D.  ANESTHESIA:  Epidural anesthesia.  DESCRIPTION OF PROCEDURE:  The patient was brought to the operating room and her epidural anesthesia that had been used for delivery was boosted. Anesthesia was deemed effective, so the urethra was prepped, a Foley catheter was inserted to drainage.  The abdomen was prepped and draped as a sterile field.  A time-out was done, sequential compression devices were in place.  The inferior portion of the umbilicus was incised between Allis clamps.  I dissected down to the fascia, elevated the fascia, incised it, entered the peritoneal cavity, and with the help of a sponge I was able to identify both tubes, traced them to their fimbriated ends, created a window in the mesial salpinx in the right and left tube placed 2 ties of 0-plain catgut proximally and distally on each tube, cut out the intervening segment, confirmed that it was a tubular segment.  There was no bleeding.  The sponge that had been used was removed.  The fascia was closed along with the peritoneum with 2 interrupted figure-of-eight sutures of 0 PDS The subcutaneous tissue with a 3-0 Vicryl and the skin was reapproximated with 3-0 plain catgut.  The patient seemed to tolerate the procedure well and was returned to recovery in satisfactory condition.  Blood loss was probably no more than 5 mL.     Malachi Pro. Ambrose Mantle, M.D.     TFH/MEDQ  D:  06/13/2013  T:  06/13/2013  Job:  [id number]

## 2013-06-13 NOTE — Progress Notes (Signed)
Patient ID: [name], female   DOB: [birth year], 32 y.o.   MRN: [id number] Pt reports no change in her health since delivery. She is certain she wants BTL.

## 2013-06-13 NOTE — Anesthesia Preprocedure Evaluation (Addendum)
Anesthesia Evaluation  Patient identified by MRN, date of birth, ID band Patient awake    Reviewed: Allergy & Precautions, H&P , NPO status , Patient's Chart, lab work & pertinent test results  Airway Mallampati: II TM Distance: >3 FB Neck ROM: full    Dental no notable dental hx.    Pulmonary neg pulmonary ROS,  breath sounds clear to auscultation  Pulmonary exam normal       Cardiovascular negative cardio ROS  Rhythm:regular Rate:Normal     Neuro/Psych negative neurological ROS  negative psych ROS   GI/Hepatic negative GI ROS, Neg liver ROS,   Endo/Other  negative endocrine ROS  Renal/GU negative Renal ROS     Musculoskeletal   Abdominal   Peds  Hematology negative hematology ROS (+)   Anesthesia Other Findings   Reproductive/Obstetrics (+) Pregnancy                          Anesthesia Physical  Anesthesia Plan  ASA: II  Anesthesia Plan: Epidural   Post-op Pain Management:    Induction:   Airway Management Planned:   Additional Equipment:   Intra-op Plan:   Post-operative Plan:   Informed Consent: I have reviewed the patients History and Physical, chart, labs and discussed the procedure including the risks, benefits and alternatives for the proposed anesthesia with the patient or authorized representative who has indicated his/her understanding and acceptance.     Plan Discussed with:   Anesthesia Plan Comments: (Patient has epidural catheter in-situ from labor yesterday. Plan is to use it for PPTL)       Anesthesia Quick Evaluation

## 2013-06-13 NOTE — Anesthesia Postprocedure Evaluation (Signed)
  Anesthesia Post Note  Patient: [name]  Procedure(s) Performed: Procedure(s) (LRB): POST PARTUM TUBAL LIGATION (Bilateral)  Anesthesia type: Epidural  Patient location: PACU  Post pain: Pain level controlled  Post assessment: Post-op Vital signs reviewed  Last Vitals:  Filed Vitals:   06/13/13 1338  BP: 150/81  Pulse: 77  Temp: 37.1 C  Resp: 20    Post vital signs: Reviewed  Level of consciousness: awake  Complications: No apparent anesthesia complications

## 2013-06-14 ENCOUNTER — Encounter (HOSPITAL_COMMUNITY): Payer: Self-pay | Admitting: Obstetrics and Gynecology

## 2013-06-14 MED ORDER — IBUPROFEN 600 MG PO TABS: 600.0000 mg | ORAL_TABLET | Freq: Four times a day (QID) | ORAL | Status: DC | PRN

## 2013-06-14 MED ORDER — HYDROCODONE-ACETAMINOPHEN 5-325 MG PO TABS: 1.0000 | ORAL_TABLET | Freq: Four times a day (QID) | ORAL | Status: DC | PRN

## 2013-06-14 NOTE — Discharge Summary (Signed)
NAMEWILLY, VORCE               ACCOUNT NO.:  [id number]  MEDICAL RECORD NO.:  [id number]  LOCATION:  9109                          FACILITY:  [facility]  PHYSICIAN:  Malachi Pro. Ambrose Mantle, M.D. DATE OF BIRTH:  [birth year]  DATE OF ADMISSION:  06/12/2013 DATE OF DISCHARGE:  06/14/2013                              DISCHARGE SUMMARY   HISTORY OF PRESENT ILLNESS:  This is a 32 year old white female, para 3- 0-1-3, gravida 5, EDC June 05, 2013, admitted at 37 weeks' gestation for induction of labor.  Blood group and type B positive, negative antibody, rubella immune, RPR nonreactive.  Urine culture negative. Hepatitis B surface antigen negative, HIV negative, GC and Chlamydia negative.  This second trimester screen negative, 1-hour Glucola 129, group B strep negative.  On admission to the hospital, the cervix was 2 cm, 20%, vertex at a -3 station.  She was placed on Pitocin and made progress to what this nurse thought was 3 cm.  She received an epidural at 12:56 p.m.  She was comfortable with her epidural.  The contractions every 2 minutes.  Cervix was 3 cm, 40%, vertex at a -3.  At 5:17 p.m., Pitocin was at 15 milliunits a minute.  Contractions every 2-3 minutes. Cervix 4-5 cm 50%, vertex at a -3.  Artificial rupture of the membranes produced clear fluid.  By 8:54 p.m., cervix was 6-7 cm.  She progressed to full dilatation with decelerations.  Oxygen was given by mask. Position was optimized.  She pushed fairly well and delivered a living female infant ROA over an intact perineum by Dr. Ambrose Mantle.  Weight was 7 pounds 6 ounces.  Apgars 8 and nine at 1 and five minutes.  Placenta intact.  Uterus normal.  Left labial laceration sutured with 3-0 chromic.  Blood loss 400 mL.  Two loops of nuchal cord were present. Postpartum, the patient requested tubal ligation.  She had signed her papers.  She underwent a tubal ligation on the first postpartum day by Dr. Ambrose Mantle and on the second postpartum and  first postoperative days, she was afebrile and felt to be ready for discharge.  LABORATORY DATA:  Showed initial hemoglobin of 12.2, hematocrit 35.2, white count 8400.  RPR nonreactive.  Followup hemoglobin 11.4.  FINAL DIAGNOSIS:  Intrauterine pregnancy, 41 weeks delivered ROA, voluntary sterilization.  OPERATION:  Spontaneous delivery ROA, repair of left labial laceration, bilateral tubal ligation.  FINAL CONDITION:  Improved.  INSTRUCTIONS:  Include our regular discharge instruction booklet.  The patient is advised to return to the office in 2 weeks to followup her incision.  She is to call with any problems.  She is given an after visit summary, and prescriptions for Motrin 600 mg 30 tablets, 1 every 6 hours as needed for pain and Vicodin 5/300, 30 tablets, 1 every 6 hours as needed for pain.    Malachi Pro. Ambrose Mantle, M.D.    TFH/MEDQ  D:  06/14/2013  T:  06/14/2013  Job:  [id number]

## 2013-06-14 NOTE — Progress Notes (Signed)
Patient ID: [name], female   DOB: 03-09-1981, 32 y.o.   MRN: [id number] #2/#1 afebrile no complaints. Abdomen soft and flat, non tender. For d/c.

## 2014-06-16 ENCOUNTER — Encounter (HOSPITAL_COMMUNITY): Payer: Self-pay | Admitting: Obstetrics and Gynecology

## 2019-07-08 ENCOUNTER — Ambulatory Visit: Payer: BC Managed Care – PPO | Admitting: Family Medicine

## 2020-08-23 ENCOUNTER — Other Ambulatory Visit: Payer: Self-pay

## 2020-08-23 ENCOUNTER — Ambulatory Visit (INDEPENDENT_AMBULATORY_CARE_PROVIDER_SITE_OTHER): Payer: Medicaid Other

## 2020-08-23 ENCOUNTER — Encounter: Payer: Self-pay | Admitting: Emergency Medicine

## 2020-08-23 ENCOUNTER — Ambulatory Visit
Admission: EM | Admit: 2020-08-23 | Discharge: 2020-08-23 | Disposition: A | Discharge status: Home / Self Care | Payer: Medicaid Other | Attending: Family Medicine | Admitting: Family Medicine

## 2020-08-23 DIAGNOSIS — U071 COVID-19: Secondary | ICD-10-CM

## 2020-08-23 DIAGNOSIS — R042 Hemoptysis: Secondary | ICD-10-CM

## 2020-08-23 MED ORDER — PREDNISONE 10 MG PO TABS: ORAL_TABLET | ORAL | 0 refills | Status: DC

## 2020-08-23 MED ORDER — DOXYCYCLINE HYCLATE 100 MG PO CAPS: 100.0000 mg | ORAL_CAPSULE | Freq: Two times a day (BID) | ORAL | 0 refills | Status: AC

## 2020-08-23 MED ORDER — BENZONATATE 200 MG PO CAPS: 200.0000 mg | ORAL_CAPSULE | Freq: Three times a day (TID) | ORAL | 0 refills | Status: AC | PRN

## 2020-08-23 NOTE — ED Provider Notes (Addendum)
[name]    CSN: [id number] Arrival date & time: 08/23/20  1210      History   Chief Complaint Chief Complaint  Patient presents with  . Cough    HPI [name] is a 40 y.o. female presenting today for evaluation of chest tightness and hemoptysis.  Reports recently had Covid.  Tested positive on 12/30.  Reports overall symptoms have improved, but continues to have some discomfort with deep inspiration, noted some blood-tinged mucus today.  Denies shortness of breath.  Reports cough is relatively mild.  Reports initially with low-grade fevers, but these have resolved throughout the week, but today again with low-grade fevers.  Denies prior DVT/PE.  Denies leg pain or leg swelling.  Denies exogenous estrogen use.  Denies tobacco use.  Denies recent travel or immobilization.  HPI  Past Medical History:  Diagnosis Date  . Hx of varicella     There are no problems to display for this patient.   Past Surgical History:  Procedure Laterality Date  . BREAST SURGERY    . CHOLECYSTECTOMY    . TUBAL LIGATION Bilateral 06/13/2013   Procedure: POST PARTUM TUBAL LIGATION;  Surgeon: Bing Plume, MD;  Location: [facility];  Service: Gynecology;  Laterality: Bilateral;  . WISDOM TOOTH EXTRACTION      OB History    Gravida  5   Para  4   Term  4   Preterm      AB  1   Living  4     SAB      IAB  1   Ectopic      Multiple      Live Births  4            Home Medications    Prior to Admission medications   Medication Sig Start Date End Date Taking? Authorizing Provider  benzonatate (TESSALON) 200 MG capsule Take 1 capsule (200 mg total) by mouth 3 (three) times daily as needed for up to 7 days for cough. 08/23/20 08/30/20 Yes Elizbeth Posa C, [name]  doxycycline (VIBRAMYCIN) 100 MG capsule Take 1 capsule (100 mg total) by mouth 2 (two) times daily for 10 days. 08/23/20 09/02/20 Yes Kalenna Millett C, [name]  predniSONE (DELTASONE) 10 MG tablet Begin with  6 tabs on day 1, 5 tab on day 2, 4 tab on day 3, 3 tab on day 4, 2 tab on day 5, 1 tab on day 6-take with food 08/23/20  Yes Shadrick Senne C, [name]  famotidine (PEPCID) 20 MG tablet Take 1 tablet (20 mg total) by mouth 2 (two) times daily. 11/30/12   Wayland Salinas, MD  HYDROcodone-acetaminophen (NORCO/VICODIN) 5-325 MG per tablet Take 1 tablet by mouth every 6 (six) hours as needed. 06/14/13   Tracey Harries, MD  ibuprofen (ADVIL,MOTRIN) 600 MG tablet Take 1 tablet (600 mg total) by mouth every 6 (six) hours as needed for pain. 06/14/13   Tracey Harries, MD    Family History History reviewed. No pertinent family history.  Social History Social History   Tobacco Use  . Smoking status: Former Games developer  . Smokeless tobacco: Never Used  Substance Use Topics  . Alcohol use: No  . Drug use: No     Allergies   Penicillins   Review of Systems Review of Systems  Constitutional: Positive for fever. Negative for activity change, appetite change, chills and fatigue.  HENT: Positive for congestion and rhinorrhea. Negative for ear pain, sinus pressure, sore throat and  trouble swallowing.   Eyes: Negative for discharge and redness.  Respiratory: Positive for cough and chest tightness. Negative for shortness of breath.   Cardiovascular: Negative for chest pain.  Gastrointestinal: Negative for abdominal pain, diarrhea, nausea and vomiting.  Musculoskeletal: Negative for myalgias.  Skin: Negative for rash.  Neurological: Negative for dizziness, light-headedness and headaches.     Physical Exam Triage Vital Signs ED Triage Vitals  Enc Vitals Group     BP      Pulse      Resp      Temp      Temp src      SpO2      Weight      Height      Head Circumference      Peak Flow      Pain Score      Pain Loc      Pain Edu?      Excl. in GC?    No data found.  Updated Vital Signs BP (!) 145/88 (BP Location: Left Arm)   Pulse (!) 117   Temp 99.7 F (37.6 C) (Oral)   Resp 18   SpO2 97%    Visual Acuity Right Eye Distance:   Left Eye Distance:   Bilateral Distance:    Right Eye Near:   Left Eye Near:    Bilateral Near:     Physical Exam Vitals and nursing note reviewed.  Constitutional:      Appearance: She is well-developed and well-nourished.     Comments: No acute distress  HENT:     Head: Normocephalic and atraumatic.     Ears:     Comments: Bilateral ears without tenderness to palpation of external auricle, tragus and mastoid, EAC's without erythema or swelling, TM's with good bony landmarks and cone of light. Non erythematous.     Nose: Nose normal.     Mouth/Throat:     Comments: Oral mucosa pink and moist, no tonsillar enlargement or exudate. Posterior pharynx patent and nonerythematous, no uvula deviation or swelling. Normal phonation.  Eyes:     Conjunctiva/sclera: Conjunctivae normal.  Cardiovascular:     Rate and Rhythm: Normal rate.  Pulmonary:     Effort: Pulmonary effort is normal. No respiratory distress.     Comments: Breathing comfortably at rest, CTABL, no wheezing, rales or other adventitious sounds auscultated Abdominal:     General: There is no distension.  Musculoskeletal:        General: Normal range of motion.     Cervical back: Neck supple.  Skin:    General: Skin is warm and dry.  Neurological:     Mental Status: She is alert and oriented to person, place, and time.  Psychiatric:        Mood and Affect: Mood and affect normal.      UC Treatments / Results  Labs (all labs ordered are listed, but only abnormal results are displayed) Labs Reviewed - No data to display  EKG   Radiology DG Chest 2 View  Result Date: 08/23/2020 CLINICAL DATA:  COVID-19 positive. Sharp stinging sensation with deep breath. Stringy right red blood in sputum. EXAM: CHEST - 2 VIEW COMPARISON:  None. FINDINGS: The heart size and mediastinal contours are within normal limits. Both lungs are clear. The visualized skeletal structures are  unremarkable. IMPRESSION: No active cardiopulmonary disease. Electronically Signed   By: Gerome Sam III M.D   On: 08/23/2020 13:08    Procedures Procedures (including critical  care time)  Medications Ordered in UC Medications - No data to display  Initial Impression / Assessment and Plan / UC Course  I have reviewed the triage vital signs and the nursing notes.  Pertinent labs & imaging results that were available during my care of the patient were reviewed by me and considered in my medical decision making (see chart for details).     Recent Covid infection, chest x-ray negative for signs of pneumonia, opting to still cover with doxycycline given length of symptoms, prednisone taper to help with underlying inflammation and to continue treatment of cough as needed with medicines.  Rest and fluids.  Low-grade fever today, continue Tylenol and ibuprofen as needed, monitor for resolution of symptoms.  Discussed strict return precautions. Patient verbalized understanding and is agreeable with plan.  Final Clinical Impressions(s) / UC Diagnoses   Final diagnoses:  COVID-19 virus infection  Hemoptysis     Discharge Instructions     Chest x-ray without pneumonia Begin doxycycline twice daily for the next 10 days Begin prednisone taper over the next 6 days-begin with 6 tablets, decrease by 1 tablet each day until complete-6, 5, 4, 3, 2, 1-take with food and in the morning if you are able Tessalon every 8 hours for cough Rest and fluids Please continue to monitor symptoms and ensure symptoms are continuing to improve, follow-up if not improving or worsening    ED Prescriptions    Medication Sig Dispense Auth. Provider   doxycycline (VIBRAMYCIN) 100 MG capsule Take 1 capsule (100 mg total) by mouth 2 (two) times daily for 10 days. 20 capsule Hilda Rynders C, [name]   predniSONE (DELTASONE) 10 MG tablet Begin with 6 tabs on day 1, 5 tab on day 2, 4 tab on day 3, 3 tab on day 4, 2  tab on day 5, 1 tab on day 6-take with food 21 tablet Nikoloz Huy C, [name]   benzonatate (TESSALON) 200 MG capsule Take 1 capsule (200 mg total) by mouth 3 (three) times daily as needed for up to 7 days for cough. 28 capsule Deretha Ertle, [place] C, [name]     PDMP not reviewed this encounter.   Daveion Robar, [place] C, [name] 08/23/20 1326    Jayland Null, [place] C, [name] 08/23/20 1404

## 2020-08-23 NOTE — Discharge Instructions (Signed)
Chest x-ray without pneumonia Begin doxycycline twice daily for the next 10 days Begin prednisone taper over the next 6 days-begin with 6 tablets, decrease by 1 tablet each day until complete-6, 5, 4, 3, 2, 1-take with food and in the morning if you are able Tessalon every 8 hours for cough Rest and fluids Please continue to monitor symptoms and ensure symptoms are continuing to improve, follow-up if not improving or worsening

## 2020-08-23 NOTE — ED Triage Notes (Signed)
Pt here for cough with blood tinged sputum and sob post covid

## 2020-09-08 ENCOUNTER — Telehealth: Payer: Medicaid Other | Admitting: Family Medicine

## 2020-11-21 ENCOUNTER — Other Ambulatory Visit: Payer: Self-pay | Admitting: Obstetrics and Gynecology

## 2020-11-21 DIAGNOSIS — R928 Other abnormal and inconclusive findings on diagnostic imaging of breast: Secondary | ICD-10-CM

## 2020-11-23 ENCOUNTER — Other Ambulatory Visit: Payer: Self-pay | Admitting: Obstetrics and Gynecology

## 2020-11-23 DIAGNOSIS — R928 Other abnormal and inconclusive findings on diagnostic imaging of breast: Secondary | ICD-10-CM

## 2020-11-27 ENCOUNTER — Other Ambulatory Visit: Payer: Self-pay

## 2020-11-27 ENCOUNTER — Ambulatory Visit
Admission: RE | Admit: 2020-11-27 | Discharge: 2020-11-27 | Disposition: A | Discharge status: Home / Self Care | Payer: BC Managed Care – PPO | Source: Ambulatory Visit | Attending: Obstetrics and Gynecology | Admitting: Obstetrics and Gynecology

## 2020-11-27 ENCOUNTER — Ambulatory Visit: Payer: BC Managed Care – PPO

## 2020-11-27 DIAGNOSIS — R928 Other abnormal and inconclusive findings on diagnostic imaging of breast: Secondary | ICD-10-CM

## 2020-12-28 ENCOUNTER — Ambulatory Visit: Payer: Medicaid Other | Admitting: Internal Medicine

## 2021-09-27 ENCOUNTER — Other Ambulatory Visit: Payer: Self-pay | Admitting: Obstetrics and Gynecology

## 2021-09-27 DIAGNOSIS — Z1231 Encounter for screening mammogram for malignant neoplasm of breast: Secondary | ICD-10-CM

## 2021-11-12 ENCOUNTER — Ambulatory Visit: Payer: BC Managed Care – PPO

## 2021-11-22 ENCOUNTER — Ambulatory Visit
Admission: RE | Admit: 2021-11-22 | Discharge: 2021-11-22 | Disposition: A | Discharge status: Home / Self Care | Payer: BC Managed Care – PPO | Source: Ambulatory Visit | Attending: Obstetrics and Gynecology | Admitting: Obstetrics and Gynecology

## 2021-11-22 DIAGNOSIS — Z1231 Encounter for screening mammogram for malignant neoplasm of breast: Secondary | ICD-10-CM

## 2022-08-01 ENCOUNTER — Encounter: Payer: Self-pay | Admitting: Sports Medicine

## 2022-08-01 ENCOUNTER — Ambulatory Visit: Payer: Self-pay

## 2022-08-01 ENCOUNTER — Ambulatory Visit (INDEPENDENT_AMBULATORY_CARE_PROVIDER_SITE_OTHER): Payer: BC Managed Care – PPO | Admitting: Sports Medicine

## 2022-08-01 DIAGNOSIS — G5603 Carpal tunnel syndrome, bilateral upper limbs: Secondary | ICD-10-CM | POA: Diagnosis not present

## 2022-08-01 DIAGNOSIS — M25531 Pain in right wrist: Secondary | ICD-10-CM

## 2022-08-01 DIAGNOSIS — M25532 Pain in left wrist: Secondary | ICD-10-CM | POA: Diagnosis not present

## 2022-08-01 MED ORDER — GABAPENTIN 300 MG PO CAPS: 300.0000 mg | ORAL_CAPSULE | Freq: Two times a day (BID) | ORAL | 1 refills | Status: DC

## 2022-08-01 NOTE — Progress Notes (Signed)
Bilateral hand/wrist pain Carpal tunnel symptoms: Numbness/tingling 20+ years Over the year gotten worse She does have braces that she wears at night for them Right is worse than left No recent EMG for either side    Patient was instructed in 10 minutes of therapeutic exercises for bilateral carpal tunnel to improve strength, ROM and function according to my instructions and plan of care by a Certified Athletic Trainer during the office visit.  Proper technique shown and discussed, handout provided.  All questions discussed and answered.

## 2022-08-01 NOTE — Progress Notes (Signed)
[name] - 41 y.o. female MRN [id number]  Date of birth: 10-19-1980  Office Visit Note: Visit Date: 08/01/2022 PCP: Patient, No Pcp Per Referred by: No ref. provider found  Subjective: Chief Complaint  Patient presents with   Left Wrist - Pain   Right Wrist - Pain   HPI: [name] is a pleasant 41 y.o. female who presents today for bilateral wrist pain, R > L.   Has been told in the past (about 20 years ago) she had carpal tunnel. Happened shortly after having her oldest child.  Since this time, her pain has come and went.  Reports more of a numbness/tingling and "odd sensation" over the volar aspect of both the right and left wrist, although right is worse than left.  This will radiate into the thumb, thenar eminence as well as the second and sometimes third digit.  Feel like she needs to shake out the hands and wrists.  Her discomfort is worse at night.  She has had a recent exacerbation of the right wrist and has gone back to her previous cock up wrist braces at nighttime which has slightly helped her pain.  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  Reports pain, but no weakness currently.  Patient will take ibuprofen for the acute flare, but does not like to take medication consistently.  Pertinent ROS were reviewed with the patient and found to be negative unless otherwise specified above in HPI.   Assessment & Plan: Visit Diagnoses:  1. Bilateral carpal tunnel syndrome   2. Pain in right wrist   3. Pain in left wrist    Plan: Discussed with [name] that she does have signs and symptoms very suggestive of bilateral carpal tunnel syndrome, right greater than left.  Given her work, she unfortunately has had a flare of this chronic condition which has been bothering her given her profession and activities of daily living.  She has bilateral cock-up wrist braces, which I  would like her to continue wearing nightly.  Given the chronicity of her condition, I would like her to begin gabapentin 300 mg nightly x 1 week, then transition to 300 mg twice daily.  She may continue taking ibuprofen rather and 10 4 majorities over-the-counter for acute pain.  We did discontinue her other previous medications on her medlist such as Pepcid, hydrocodone, prednisone which she is no longer taking.  We also discussed proceeding with EMG/nerve conduction studies given the chronicity and the fact that some of her provocative maneuvers were not positive today.  She would like to hold off on this for now and see what kind of response she gets to gabapentin and bracing.  Also recommended she take vitamin B6 as an over-the-counter supplement for nerve health.  Did discuss role of ultrasound-guided carpal tunnel injection, she will hold on this for now although it may be an option down the road.  We will follow-up in 6 weeks for reevaluation.  Follow-up: Return in about 6 weeks (around 09/12/2022).   Meds & Orders:  Meds ordered this encounter  Medications   gabapentin (NEURONTIN) 300 MG capsule    Sig: Take 1 capsule (300 mg total) by mouth 2 (two) times daily. Take 1 capsule in evening x 1 week. Then increase on 2nd week to 1 capsule twice a day.    Dispense:  60 capsule  Refill:  1    Orders Placed This Encounter  Procedures   [place] Extrem Up Right Ltd     Procedures: No procedures performed      Clinical History: No specialty comments available.  She reports that she has quit smoking. She has never used smokeless tobacco. No results for input(s): "HGBA1C", "LABURIC" in the last 8760 hours.  Objective:    Physical Exam  Gen: Well-appearing, in no acute distress; non-toxic CV: Regular Rate. Well-perfused. Warm.  Resp: Breathing unlabored on room air; no wheezing. Psych: Fluid speech in conversation; appropriate affect; normal thought process Neuro: Sensation intact throughout.  No gross coordination deficits.   Ortho Exam - Bilateral wrists: Examination of bilateral wrist shows no bony bossing, swelling.  The right volar wrist does have a soft tissue burn over the distal volar forearm.  There is full range of motion at the wrist in both flexion and extension.  5/5 strength of the wrist and fingers. + Equivocal Tinel's testing at the carpal tunnel on the right, negative on the left.  Phalen's and reverse Phalen's does not produce any provocative symptoms.  Neurovascularly intact distally, cap refill less than 2 seconds.  Imaging: [place] Extrem Up Right Ltd  Result Date: 08/01/2022 Limited musculoskeletal ultrasound of the right wrist was performed.  Dynamic scanning was performed from the family to outlet of the carpal tunnel.  Just prior to the carpal tunnel outlet, the median nerve does appear flattened, slightly swollen with loss of normal fibular echotexture.  Median nerve in this region has an area of 0.16 cm.  Radial artery vein appreciated.  No other significant hyperemia noted throughout the wrist.  Underlying distal radius without cortical irregularity.     Past Medical/Family/Surgical/Social History: Medications & Allergies reviewed per EMR, new medications updated. There are no problems to display for this patient.  Past Medical History:  Diagnosis Date   Hx of varicella    History reviewed. No pertinent family history. Past Surgical History:  Procedure Laterality Date   BREAST SURGERY     CHOLECYSTECTOMY     TUBAL LIGATION Bilateral 06/13/2013   Procedure: POST PARTUM TUBAL LIGATION;  Surgeon: Bing Plume, MD;  Location: [facility];  Service: Gynecology;  Laterality: Bilateral;   WISDOM TOOTH EXTRACTION     Social History   Occupational History   Not on file  Tobacco Use   Smoking status: Former   Smokeless tobacco: Never  Substance and Sexual Activity   Alcohol use: No   Drug use: No   Sexual activity: Never

## 2022-10-19 ENCOUNTER — Other Ambulatory Visit: Payer: Self-pay | Admitting: Obstetrics and Gynecology

## 2022-10-19 ENCOUNTER — Encounter: Payer: Self-pay | Admitting: Obstetrics and Gynecology

## 2022-10-19 DIAGNOSIS — Z1231 Encounter for screening mammogram for malignant neoplasm of breast: Secondary | ICD-10-CM

## 2022-11-30 ENCOUNTER — Ambulatory Visit
Admission: RE | Admit: 2022-11-30 | Discharge: 2022-11-30 | Disposition: A | Discharge status: Home / Self Care | Payer: BC Managed Care – PPO | Source: Ambulatory Visit | Attending: Obstetrics and Gynecology | Admitting: Obstetrics and Gynecology

## 2022-11-30 DIAGNOSIS — Z1231 Encounter for screening mammogram for malignant neoplasm of breast: Secondary | ICD-10-CM

## 2023-10-25 IMAGING — MG MM DIGITAL SCREENING BILAT W/ TOMO AND CAD
6 of 12 series · 6 of 36 positions shown · non-contrast
Comparison: Previous exam(s).

CLINICAL DATA: Screening.

EXAM:
DIGITAL SCREENING BILATERAL MAMMOGRAM WITH TOMOSYNTHESIS AND CAD
TECHNIQUE: Bilateral screening digital craniocaudal and mediolateral oblique
mammograms were obtained. Bilateral screening digital breast
tomosynthesis was performed. The images were evaluated with
computer-aided detection.

[R MLO synth-2D]
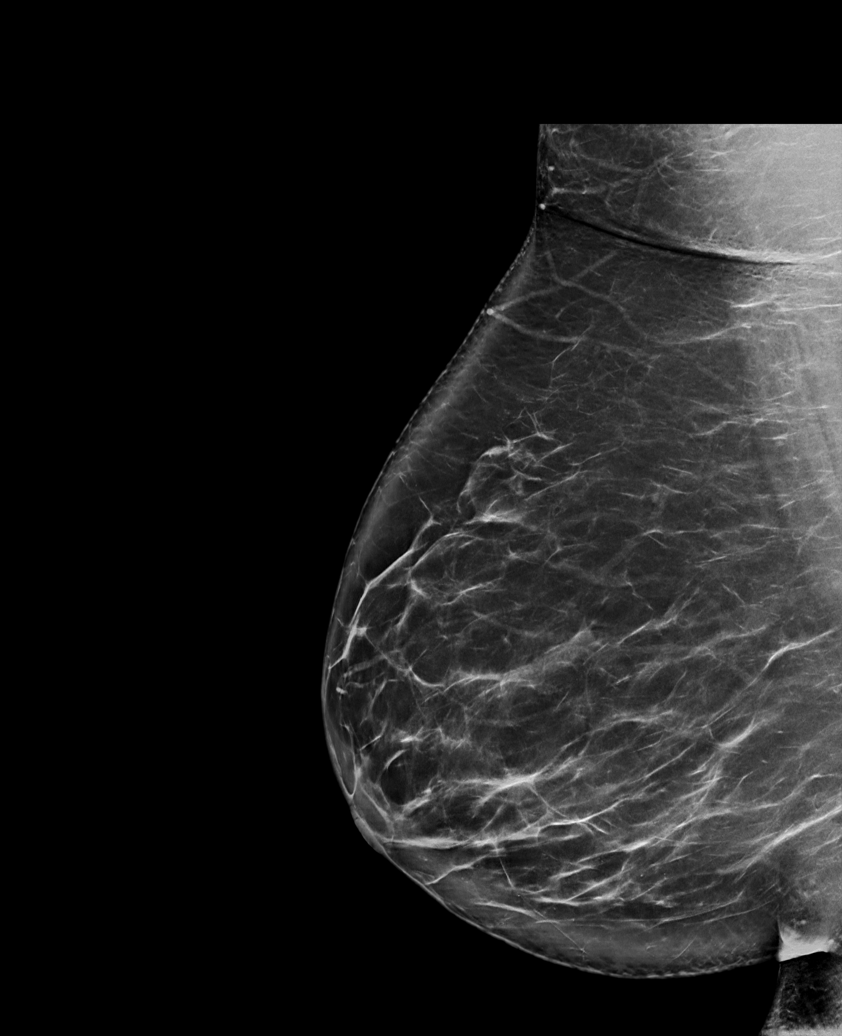

[R CC synth-2D (1 of 2)]
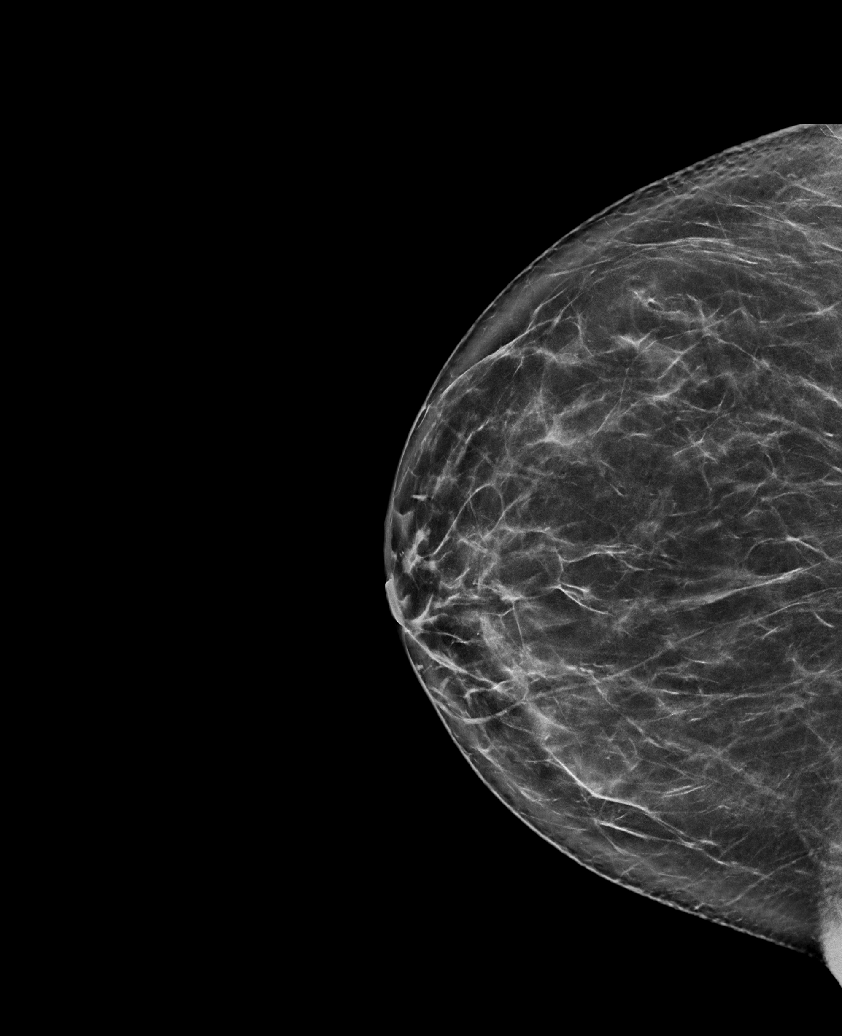

[R CC synth-2D (2 of 2)]
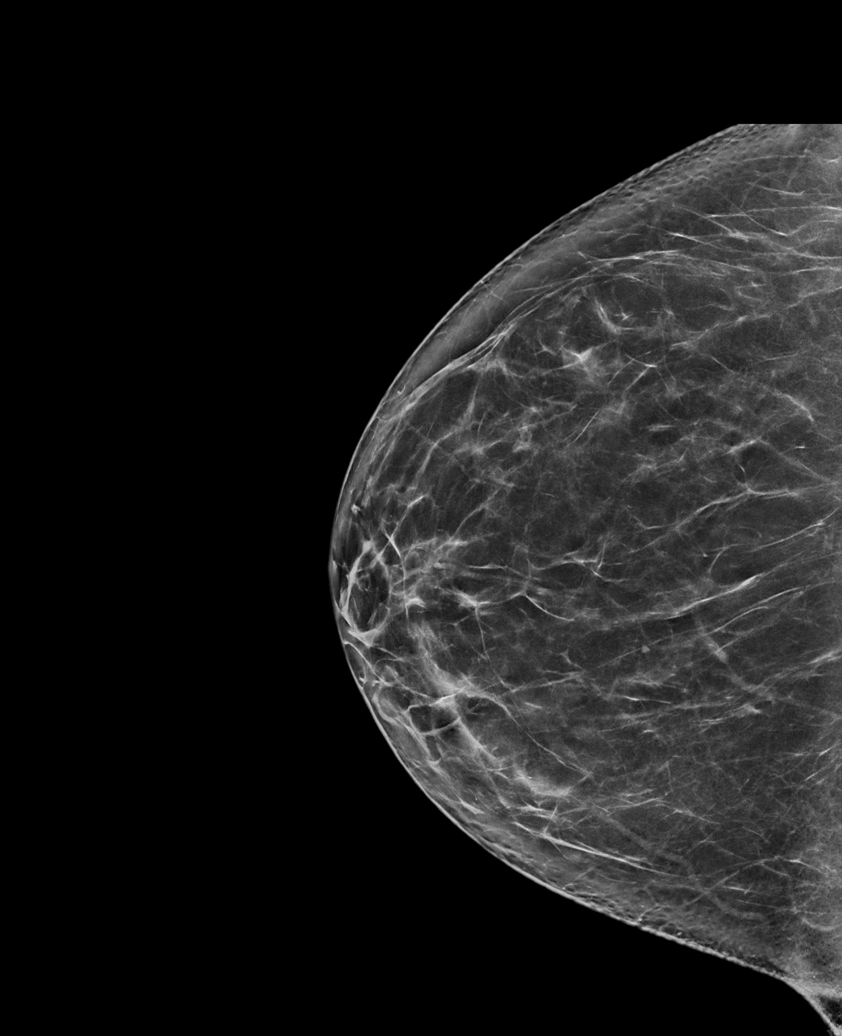

[L MLO synth-2D]
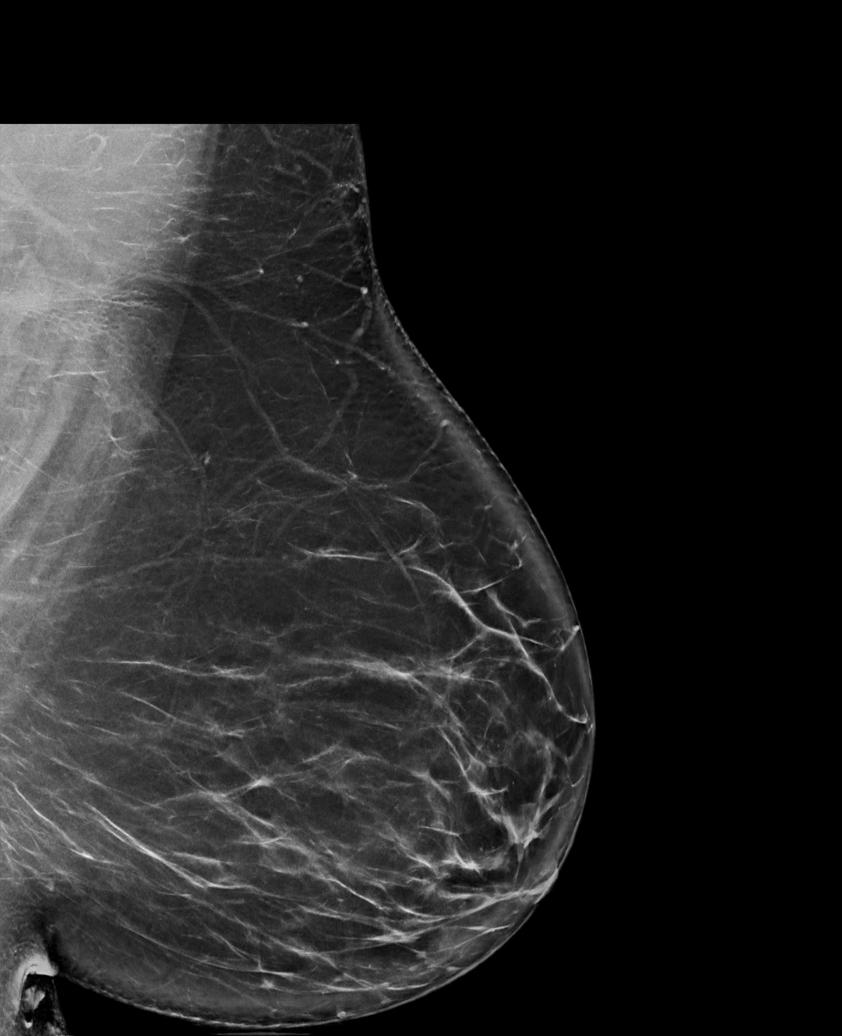

[L CC synth-2D (1 of 2)]
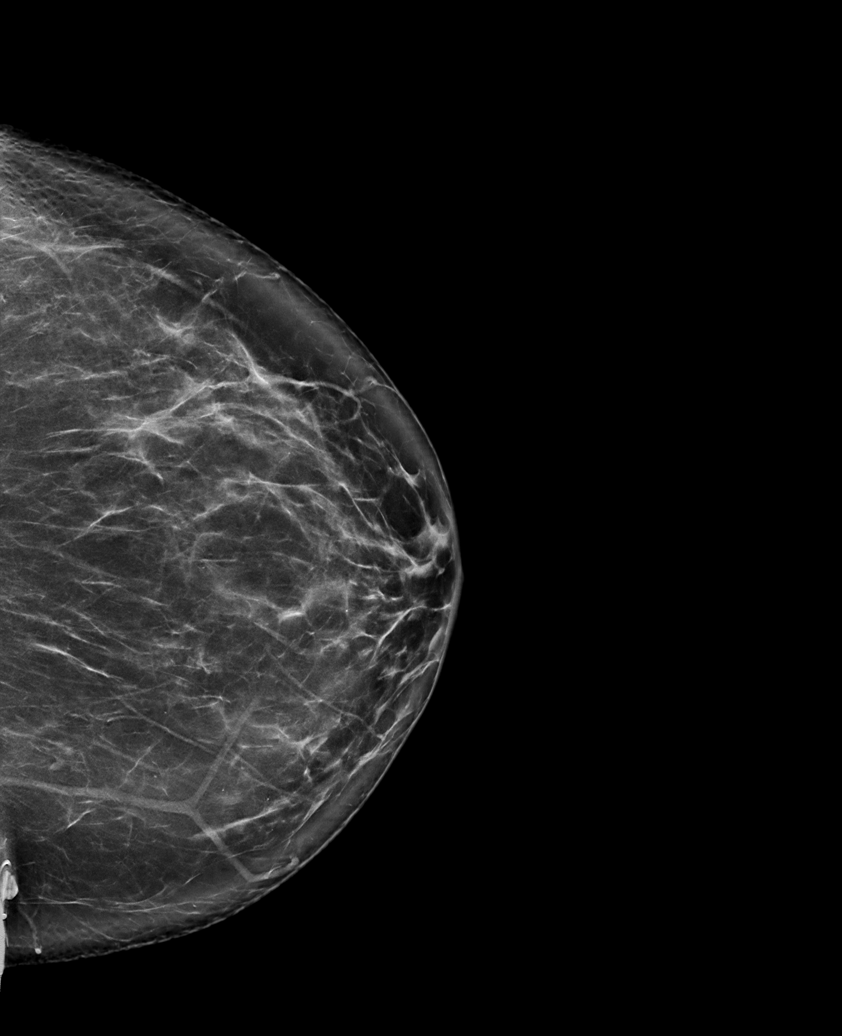

[L CC synth-2D (2 of 2)]
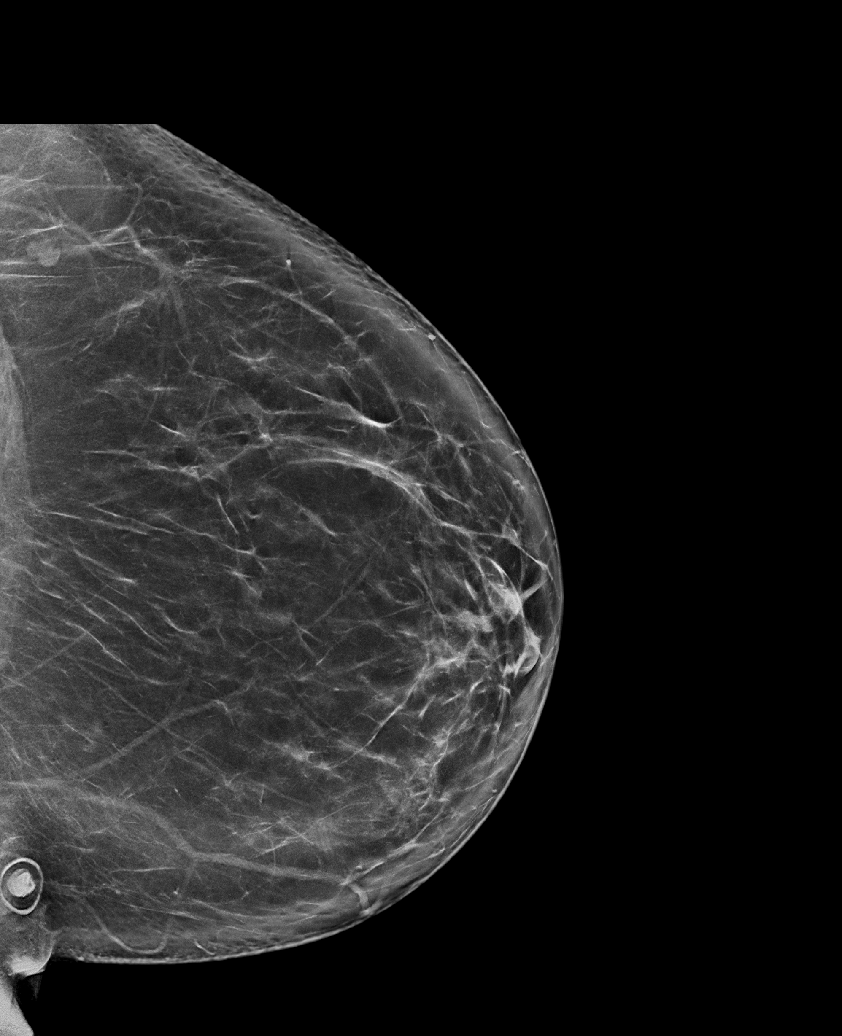

[6 of 36 positions shown; findings below may reference images not displayed]

ACR Breast Density Category b: There are scattered areas of
fibroglandular density.
FINDINGS: There are no findings suspicious for malignancy.
IMPRESSION: No mammographic evidence of malignancy. A result letter of this
screening mammogram will be mailed directly to the patient.

RECOMMENDATION:
Screening mammogram in one year. (Code:51-O-LD2)

BI-RADS CATEGORY  1: Negative.

## 2024-01-01 ENCOUNTER — Other Ambulatory Visit: Payer: Self-pay | Admitting: Obstetrics and Gynecology

## 2024-01-01 DIAGNOSIS — Z1231 Encounter for screening mammogram for malignant neoplasm of breast: Secondary | ICD-10-CM

## 2024-01-26 ENCOUNTER — Encounter: Admitting: Podiatry

## 2024-01-28 NOTE — Progress Notes (Signed)
 Patient did not show for her scheduled appointment on 01/26/2024

## 2024-02-02 ENCOUNTER — Ambulatory Visit
Admission: RE | Admit: 2024-02-02 | Discharge: 2024-02-02 | Disposition: A | Discharge status: Home / Self Care | Source: Ambulatory Visit | Attending: Obstetrics and Gynecology | Admitting: Obstetrics and Gynecology

## 2024-02-02 DIAGNOSIS — Z1231 Encounter for screening mammogram for malignant neoplasm of breast: Secondary | ICD-10-CM

## 2024-04-16 ENCOUNTER — Encounter: Payer: Self-pay | Admitting: Physician Assistant

## 2024-06-05 ENCOUNTER — Ambulatory Visit: Admitting: Physician Assistant

## 2024-08-09 ENCOUNTER — Encounter: Payer: Self-pay | Admitting: Physician Assistant

## 2024-08-09 ENCOUNTER — Ambulatory Visit: Admitting: Physician Assistant

## 2024-08-09 ENCOUNTER — Other Ambulatory Visit

## 2024-08-09 VITALS — BP 150/70 | HR 68 | Ht 67.0 in | Wt 288.2 lb

## 2024-08-09 DIAGNOSIS — K625 Hemorrhage of anus and rectum: Secondary | ICD-10-CM

## 2024-08-09 DIAGNOSIS — K5900 Constipation, unspecified: Secondary | ICD-10-CM | POA: Diagnosis not present

## 2024-08-09 DIAGNOSIS — R198 Other specified symptoms and signs involving the digestive system and abdomen: Secondary | ICD-10-CM

## 2024-08-09 LAB — CBC WITH DIFFERENTIAL/PLATELET
Basophils Absolute: 0.1 K/uL (ref 0.0–0.1)
Basophils Relative: 0.8 % (ref 0.0–3.0)
Eosinophils Absolute: 0.1 K/uL (ref 0.0–0.7)
Eosinophils Relative: 1.9 % (ref 0.0–5.0)
HCT: 40 % (ref 36.0–46.0)
Hemoglobin: 13.2 g/dL (ref 12.0–15.0)
Lymphocytes Relative: 20.3 % (ref 12.0–46.0)
Lymphs Abs: 1.4 K/uL (ref 0.7–4.0)
MCHC: 33 g/dL (ref 30.0–36.0)
MCV: 84.5 fl (ref 78.0–100.0)
Monocytes Absolute: 0.6 K/uL (ref 0.1–1.0)
Monocytes Relative: 8.1 % (ref 3.0–12.0)
Neutro Abs: 4.9 K/uL (ref 1.4–7.7)
Neutrophils Relative %: 68.9 % (ref 43.0–77.0)
Platelets: 170 K/uL (ref 150.0–400.0)
RBC: 4.74 Mil/uL (ref 3.87–5.11)
RDW: 13.8 % (ref 11.5–15.5)
WBC: 7.1 K/uL (ref 4.0–10.5)

## 2024-08-09 MED ORDER — NA SULFATE-K SULFATE-MG SULF 17.5-3.13-1.6 GM/177ML PO SOLN: 1.0000 | Freq: Once | ORAL | 0 refills | Status: AC

## 2024-08-09 NOTE — Patient Instructions (Signed)
 Your provider has requested that you go to the basement level for lab work before leaving today. Press B on the elevator. The lab is located at the first door on the left as you exit the elevator.  You have been scheduled for a Colonoscopy. Please follow written instructions given to you at your visit today.   If you use inhalers (even only as needed), please bring them with you on the day of your procedure.  DO NOT TAKE 7 DAYS PRIOR TO TEST- Trulicity (dulaglutide) Ozempic, Wegovy (semaglutide) Mounjaro (tirzepatide) Bydureon Bcise (exanatide extended release)  DO NOT TAKE 1 DAY PRIOR TO YOUR TEST Rybelsus (semaglutide) Adlyxin (lixisenatide) Victoza (liraglutide) Byetta (exanatide) ___________________________________________________________________________  Please follow up sooner if symptoms increase or worsen   Due to recent changes in healthcare laws, you may see the results of your imaging and laboratory studies on MyChart before your provider has had a chance to review them.  We understand that in some cases there may be results that are confusing or concerning to you. Not all laboratory results come back in the same time frame and the provider may be waiting for multiple results in order to interpret others.  Please give us  48 hours in order for your provider to thoroughly review all the results before contacting the office for clarification of your results.   Thank you for trusting me with your gastrointestinal care!   [name], [name] _______________________________________________________  If your blood pressure at your visit was 140/90 or greater, please contact your primary care physician to follow up on this.  _______________________________________________________  If you are age 12 or older, your body mass index should be between 23-30. Your Body mass index is 45.15 kg/m. If this is out of the aforementioned range listed, please consider follow up with your  Primary Care Provider.  If you are age 58 or younger, your body mass index should be between 19-25. Your Body mass index is 45.15 kg/m. If this is out of the aformentioned range listed, please consider follow up with your Primary Care Provider.   ________________________________________________________  The [facility] providers would like to encourage you to use MYCHART to communicate with providers for non-urgent requests or questions.  Due to long hold times on the telephone, sending your provider a message by [facility] may be a faster and more efficient way to get a response.  Please allow 48 business hours for a response.  Please remember that this is for non-urgent requests.  _______________________________________________________

## 2024-08-09 NOTE — Progress Notes (Signed)
 "     [name], [name] [address] [place], [place]  [address] Phone: [phone]   Gastroenterology Consultation  Referring Provider:     Danielle Rom, MD Primary Care Physician:  Danielle Rom, MD Primary Gastroenterologist:  [name], [name] / Glendia Holt, MD  Reason for Consultation:     Blood in stool        HPI:   Discussed the use of AI scribe software for clinical note transcription with the patient, who gave verbal consent to proceed.  [name] is a 43 year old female with prior cholecystectomy who presents for evaluation of intermittent rectal bleeding.  New patient.  No previous GI evaluation or Colonoscopy.  No recent labs. History of Present Illness Rectal bleeding - Intermittent episodes since July 2025. - Initial episode in July 2025: small amount of blood at the end of stool, associated with constipation. - Second episode in October 2025: small amount of blood seen on  tissue after wiping, no visible blood in stool. - Most recent episode last week: spot in stool that appeared to be blood, uncertain if it was blood or food. - No history of large volume or heavy bleeding.  Altered bowel habits - Bowel habits fluctuate between constipation and soft stools. - Occasional painful tearing or ripping sensation during bowel movements. - Stool consistency has been variable since cholecystectomy in 2003, with episodes of diarrhea and constipation.  Medication and supplement use - Began taking fiber gummies at night after initial episode in July 2025. - Uses beet gummies in the morning for blood pressure. - No prescription medications used; prefers to avoid them.   Past Medical History:  Diagnosis Date   Hx of varicella     Past Surgical History:  Procedure Laterality Date   BREAST SURGERY     CHOLECYSTECTOMY     TUBAL LIGATION Bilateral 06/13/2013   Procedure: POST PARTUM TUBAL LIGATION;  Surgeon: Debby JULIANNA Lares, MD;   Location: [facility];  Service: Gynecology;  Laterality: Bilateral;   WISDOM TOOTH EXTRACTION      Prior to Admission medications  Medication Sig Start Date End Date Taking? Authorizing Provider  gabapentin  (NEURONTIN ) 300 MG capsule Take 1 capsule (300 mg total) by mouth 2 (two) times daily. Take 1 capsule in evening x 1 week. Then increase on 2nd week to 1 capsule twice a day. 08/01/22   Brooks, Dana, DO    History reviewed. No pertinent family history.   Social History[1]  Allergies as of 08/09/2024 - Review Complete 08/09/2024  Allergen Reaction Noted   Penicillins Rash 11/30/2012    Review of Systems:    All systems reviewed and negative except where noted in HPI.   Physical Exam:  BP (!) 150/70   Pulse 68   Ht 5' 7 (1.702 m)   Wt 288 lb 4 oz (130.7 kg)   BMI 45.15 kg/m  No LMP recorded. (Menstrual status: IUD).  General:   Alert,  Well-developed, well-nourished, pleasant and cooperative in NAD Lungs:  Respirations even and unlabored.  Clear throughout to auscultation.   No wheezes, crackles, or rhonchi. No acute distress. Heart:  Regular rate and rhythm; no murmurs, clicks, rubs, or gallops. Abdomen:  Normal bowel sounds.  No bruits.  Soft, and non-distended without masses, hepatosplenomegaly or hernias noted.  No Tenderness.  No guarding or rebound tenderness.    Neurologic:  Alert and oriented x3;  grossly normal neurologically. Psych:  Alert and cooperative. Normal mood and affect.  Rectal:  Deferred until time of Colonoscopy.   Imaging Studies: No results found.  Labs: CBC    Component Value Date/Time   WBC 7.1 08/09/2024 1045   RBC 4.74 08/09/2024 1045   HGB 13.2 08/09/2024 1045   HCT 40.0 08/09/2024 1045   PLT 170.0 08/09/2024 1045   MCV 84.5 08/09/2024 1045    CMP     Component Value Date/Time   NA 138 10/25/2009 0524   K 3.9 10/25/2009 0524   CL 110 10/25/2009 0524   GLUCOSE 95 10/25/2009 0524   BUN 9 10/25/2009 0524   CREATININE 0.5 10/25/2009  0524    Assessment and Plan:   [name] is a 43 y.o. y/o female has been referred for: Assessment & Plan Rectal bleeding Intermittent, small-volume, painless rectal bleeding of unclear etiology. Differential includes hemorrhoids, anal fissure, and less likely inflammatory bowel disease or colorectal neoplasia. Colonoscopy indicated for diagnostic clarification and colorectal cancer screening. - Lab: CBC - screen for anemia. - Scheduling Colonoscopy I discussed risks of colonoscopy with patient to include risk of bleeding, colon perforation, and risk of sedation.  Patient expressed understanding and agrees to proceed with colonoscopy.  - Advised discontinuation of fiber supplements one week prior to colonoscopy and adherence to a low-fiber diet (no raw vegetables, seeds, or nuts) during that week.  2.  Irregular Bowel habits  Intermittent constipation with variable stool consistency post-cholecystectomy, currently managed with fiber gummies.  - Advised continuation of fiber gummies for bowel regularity except for the week prior to colonoscopy, when she should be discontinued. - Provided anticipatory guidance regarding low-fiber diet the week before colonoscopy.  Follow up Based on colonoscopy results and GI symptoms.  [name], [name]       [1]  Social History Tobacco Use   Smoking status: Former   Smokeless tobacco: Never  Substance Use Topics   Alcohol use: No   Drug use: No   "

## 2024-08-10 ENCOUNTER — Encounter: Payer: Self-pay | Admitting: Physician Assistant

## 2024-08-11 ENCOUNTER — Ambulatory Visit: Payer: Self-pay | Admitting: Physician Assistant

## 2024-08-16 NOTE — Progress Notes (Signed)
 Agree with the assessment and plan as outlined by Brigitte Canard, [name].

## 2024-09-09 ENCOUNTER — Encounter: Payer: Self-pay | Admitting: Gastroenterology

## 2024-09-17 ENCOUNTER — Ambulatory Visit: Admitting: Gastroenterology

## 2024-09-17 ENCOUNTER — Encounter: Payer: Self-pay | Admitting: Gastroenterology

## 2024-09-17 VITALS — BP 120/77 | HR 67 | Temp 97.5°F | Resp 17 | Ht 67.0 in | Wt 288.0 lb

## 2024-09-17 DIAGNOSIS — K625 Hemorrhage of anus and rectum: Secondary | ICD-10-CM

## 2024-09-17 DIAGNOSIS — D124 Benign neoplasm of descending colon: Secondary | ICD-10-CM

## 2024-09-17 DIAGNOSIS — D120 Benign neoplasm of cecum: Secondary | ICD-10-CM

## 2024-09-17 DIAGNOSIS — R198 Other specified symptoms and signs involving the digestive system and abdomen: Secondary | ICD-10-CM

## 2024-09-17 MED ORDER — SODIUM CHLORIDE 0.9 % IV SOLN: 500.0000 mL | Freq: Once | INTRAVENOUS | Status: DC

## 2024-09-17 NOTE — Progress Notes (Signed)
 Called to room to assist during endoscopic procedure.  Patient ID and intended procedure confirmed with present staff. Received instructions for my participation in the procedure from the performing physician.

## 2024-09-17 NOTE — Progress Notes (Signed)
 Report to PACU, RN, vss, BBS= Clear.

## 2024-09-17 NOTE — Progress Notes (Signed)
 [facility] History and Physical   Primary Care Physician:  Danielle Rom, MD   Reason for Procedure:   Rectal bleeding  Plan:    Colonoscopy   HPI: [name] is a 44 y.o. female undergoing colonoscopy to evaluate rectal bleeding and change in bowel habits.  She has no family history of colon cancer.   The patient was provided an opportunity to ask questions and all were answered. The patient agreed with the plan   Past Medical History:  Diagnosis Date   Hx of varicella     Past Surgical History:  Procedure Laterality Date   CHOLECYSTECTOMY     TUBAL LIGATION Bilateral 06/13/2013   Procedure: POST PARTUM TUBAL LIGATION;  Surgeon: Debby JULIANNA Lares, MD;  Location: [facility];  Service: Gynecology;  Laterality: Bilateral;   WISDOM TOOTH EXTRACTION      Prior to Admission medications  Medication Sig Start Date End Date Taking? Authorizing Provider  HYDROCORTISONE, TOPICAL, 2.5 % SOLN Apply 1 drop topically. 08/10/22   [provider]    Current Outpatient Medications  Medication Sig Dispense Refill   HYDROCORTISONE, TOPICAL, 2.5 % SOLN Apply 1 drop topically.     Current Facility-Administered Medications  Medication Dose Route Frequency Provider Last Rate Last Admin   0.9 %  sodium chloride  infusion  500 mL Intravenous Once Stacia Glendia BRAVO, MD        Allergies as of 09/17/2024 - Review Complete 09/17/2024  Allergen Reaction Noted   Penicillins Rash 11/30/2012    Family History  Problem Relation Age of Onset   Colon cancer Neg Hx    Esophageal cancer Neg Hx    Rectal cancer Neg Hx    Stomach cancer Neg Hx     Social History   Socioeconomic History   Marital status: Single    Spouse name: Not on file   Number of children: Not on file   Years of education: Not on file   Highest education level: Not on file  Occupational History   Not on file  Tobacco Use   Smoking status: Former   Smokeless tobacco: Never  Vaping Use    Vaping status: Never Used  Substance and Sexual Activity   Alcohol use: No   Drug use: No   Sexual activity: Never  Other Topics Concern   Not on file  Social History Narrative   Not on file   Social Drivers of Health   Tobacco Use: Medium Risk (09/17/2024)   Patient History    Smoking Tobacco Use: Former    Smokeless Tobacco Use: Never    Passive Exposure: Not on Actuary Strain: Not on file  Food Insecurity: Low Risk (02/17/2023)   Received from [facility]   Epic    Within the past 12 months, you worried that your food would run out before you got money to buy more: Never true    Within the past 12 months, the food you bought just didn't last and you didn't have money to get more. : Never true  Transportation Needs: Not on file (02/17/2023)  Physical Activity: Not on file  Stress: Not on file  Social Connections: Socially Integrated (05/20/2022)   Received from [facility]   Social Network    How would you rate your social network (family, work, friends)?: Good participation with social networks  Intimate Partner Violence: Not At Risk (05/20/2022)   Received from [facility]   HITS    Over the last 12  months how often did your partner physically hurt you?: Never    Over the last 12 months how often did your partner insult you or talk down to you?: Never    Over the last 12 months how often did your partner threaten you with physical harm?: Never    Over the last 12 months how often did your partner scream or curse at you?: Never  Depression (PHQ2-9): Not on file  Alcohol Screen: Not on file  Housing: Low Risk (02/17/2023)   Received from [facility]   Epic    What is your living situation today?: I have a steady place to live    Think about the place you live. Do you have problems with any of the following? Choose all that apply:: Not on file  Utilities: Low Risk (02/17/2023)   Received from [facility]   Utilities    In the past 12 months has the  electric, gas, oil, or water company threatened to shut off services in your home? : No  Health Literacy: Not on file    Review of Systems:  All other review of systems negative except as mentioned in the HPI.  Physical Exam: Vital signs BP (!) 159/102   Temp (!) 97.5 F (36.4 C)   Ht 5' 7 (1.702 m)   Wt 288 lb (130.6 kg)   LMP 09/05/2024   SpO2 100%   BMI 45.11 kg/m   General:   Alert,  Well-developed, well-nourished, pleasant and cooperative in NAD Airway:  Mallampati 3 Lungs:  Clear throughout to auscultation.   Heart:  Regular rate and rhythm; no murmurs, clicks, rubs,  or gallops. Abdomen:  Soft, nontender and nondistended. Normal bowel sounds.   Neuro/Psych:  Normal mood and affect. A and O x 3   [name] E. Stacia, MD [facility]

## 2024-09-17 NOTE — Patient Instructions (Signed)
 Please read handouts provided. Continue present medications. Resume previous diet. Await pathology results. Recommend daily fiber supplement ( Metamucil ).   YOU HAD AN ENDOSCOPIC PROCEDURE TODAY AT THE [facility]:   Refer to the procedure report that was given to you for any specific questions about what was found during the examination.  If the procedure report does not answer your questions, please call your gastroenterologist to clarify.  If you requested that your care partner not be given the details of your procedure findings, then the procedure report has been included in a sealed envelope for you to review at your convenience later.  YOU SHOULD EXPECT: Some feelings of bloating in the abdomen. Passage of more gas than usual.  Walking can help get rid of the air that was put into your GI tract during the procedure and reduce the bloating. If you had a lower endoscopy (such as a colonoscopy or flexible sigmoidoscopy) you may notice spotting of blood in your stool or on the toilet paper. If you underwent a bowel prep for your procedure, you may not have a normal bowel movement for a few days.  Please Note:  You might notice some irritation and congestion in your nose or some drainage.  This is from the oxygen used during your procedure.  There is no need for concern and it should clear up in a day or so.  SYMPTOMS TO REPORT IMMEDIATELY:  Following lower endoscopy (colonoscopy or flexible sigmoidoscopy):  Excessive amounts of blood in the stool  Significant tenderness or worsening of abdominal pains  Swelling of the abdomen that is new, acute  Fever of 100F or higher.  For urgent or emergent issues, a gastroenterologist can be reached at any hour by calling [phone]. Do not use MyChart messaging for urgent concerns.    DIET:  We do recommend a small meal at first, but then you may proceed to your regular diet.  Drink plenty of fluids but you should avoid alcoholic  beverages for 24 hours.  ACTIVITY:  You should plan to take it easy for the rest of today and you should NOT DRIVE or use heavy machinery until tomorrow (because of the sedation medicines used during the test).    FOLLOW UP: Our staff will call the number listed on your records the next business day following your procedure.  We will call around 7:15- 8:00 am to check on you and address any questions or concerns that you may have regarding the information given to you following your procedure. If we do not reach you, we will leave a message.     If any biopsies were taken you will be contacted by phone or by letter within the next 1-3 weeks.  Please call us  at [phone] if you have not heard about the biopsies in 3 weeks.    SIGNATURES/CONFIDENTIALITY: You and/or your care partner have signed paperwork which will be entered into your electronic medical record.  These signatures attest to the fact that that the information above on your After Visit Summary has been reviewed and is understood.  Full responsibility of the confidentiality of this discharge information lies with you and/or your care-partner.

## 2024-09-18 ENCOUNTER — Telehealth: Payer: Self-pay | Admitting: *Deleted

## 2024-09-18 NOTE — Telephone Encounter (Signed)
" °  Follow up Call-     09/17/2024    1:41 PM  Call back number  Post procedure Call Back phone  # [phone]  Permission to leave phone message Yes     Patient questions:  Do you have a fever, pain , or abdominal swelling? No. Pain Score  0 *  Have you tolerated food without any problems? Yes.    Have you been able to return to your normal activities? Yes.    Do you have any questions about your discharge instructions: Diet   No. Medications  No. Follow up visit  No.  Do you have questions or concerns about your Care? No.  Actions: * If pain score is 4 or above: No action needed, pain <4.   "
